# Patient Record
Sex: Female | Born: 1985 | Race: Black or African American | Hispanic: No | Marital: Single | Smoking: Never smoker
Health system: Southern US, Community
[De-identification: ages and names within clinical notes are randomized; demographics above are authoritative.]

## PROBLEM LIST (undated history)

## (undated) DIAGNOSIS — I4891 Unspecified atrial fibrillation: Secondary | ICD-10-CM

## (undated) HISTORY — PX: NO PAST SURGERIES: SHX2092

---

## 2013-04-12 ENCOUNTER — Encounter (HOSPITAL_COMMUNITY): Payer: Self-pay | Admitting: Emergency Medicine

## 2013-04-12 ENCOUNTER — Emergency Department (HOSPITAL_COMMUNITY)
Admission: EM | Admit: 2013-04-12 | Discharge: 2013-04-12 | Disposition: A | Discharge status: Home / Self Care | Payer: Medicaid Other | Attending: Emergency Medicine | Admitting: Emergency Medicine

## 2013-04-12 DIAGNOSIS — J029 Acute pharyngitis, unspecified: Secondary | ICD-10-CM

## 2013-04-12 DIAGNOSIS — R109 Unspecified abdominal pain: Secondary | ICD-10-CM | POA: Insufficient documentation

## 2013-04-12 DIAGNOSIS — Z8679 Personal history of other diseases of the circulatory system: Secondary | ICD-10-CM | POA: Insufficient documentation

## 2013-04-12 DIAGNOSIS — Z7982 Long term (current) use of aspirin: Secondary | ICD-10-CM | POA: Insufficient documentation

## 2013-04-12 HISTORY — DX: Unspecified atrial fibrillation: I48.91

## 2013-04-12 MED ORDER — IBUPROFEN 400 MG PO TABS
600.0000 mg | ORAL_TABLET | Freq: Once | ORAL | Status: AC
  Administered 2013-04-12: 600 mg via ORAL
  Filled 2013-04-12 (×2): qty 1

## 2013-04-12 MED ORDER — IBUPROFEN 600 MG PO TABS: 600.0000 mg | ORAL_TABLET | Freq: Four times a day (QID) | ORAL | Status: DC | PRN

## 2013-04-12 NOTE — ED Notes (Signed)
Pt. reports sore throat , fever and body aches onset this afternoon .

## 2013-04-12 NOTE — ED Provider Notes (Signed)
Medical screening examination/treatment/procedure(s) were performed by non-physician practitioner and as supervising physician I was immediately available for consultation/collaboration.   Loren Racer, MD 04/12/13 [phone]

## 2013-04-12 NOTE — ED Provider Notes (Signed)
CSN: [id number]     Arrival date & time 04/12/13  0105 History   First MD Initiated Contact with Patient 04/12/13 0154     Chief Complaint  Patient presents with  . Sore Throat   (Consider location/radiation/quality/duration/timing/severity/associated sxs/prior Treatment) HPI Comments: Developed sore throat.  This, afternoon.  She was at work.  She has not taken any medication.  Prior to arrival.  Denies any URI symptoms, postnasal drip, seasonal allergies, fever, chills, headache  Patient is a 27 y.o. female presenting with pharyngitis. The history is provided by the patient.  Sore Throat This is a new problem. The problem occurs constantly. The problem has been unchanged. Associated symptoms include abdominal pain and a sore throat. Pertinent negatives include no chills, fever, headaches, myalgias, neck pain or swollen glands. The symptoms are aggravated by swallowing. She has tried nothing for the symptoms. The treatment provided no relief.    Past Medical History  Diagnosis Date  . Atrial fibrillation    History reviewed. No pertinent past surgical history. No family history on file. History  Substance Use Topics  . Smoking status: Never Smoker   . Smokeless tobacco: Not on file  . Alcohol Use: No   OB History   Grav Para Term Preterm Abortions TAB SAB Ect Mult Living                 Review of Systems  Constitutional: Negative for fever and chills.  HENT: Positive for sore throat. Negative for trouble swallowing.   Gastrointestinal: Positive for abdominal pain.  Musculoskeletal: Negative for myalgias and neck pain.  Neurological: Negative for headaches.  All other systems reviewed and are negative.    Allergies  Review of patient's allergies indicates no known allergies.  Home Medications   Current Outpatient Rx  Name  Route  Sig  Dispense  Refill  . aspirin 81 MG tablet   Oral   Take 81 mg by mouth daily.          BP 118/70  Pulse 99  Temp(Src) 99.6 F  (37.6 C) (Oral)  Resp 18  Wt 105 lb (47.628 kg)  SpO2 98%  LMP 03/07/2013 Physical Exam  Nursing note and vitals reviewed. Constitutional: She is oriented to person, place, and time. She appears well-developed and well-nourished.  HENT:  Head: Normocephalic.  Mouth/Throat: Uvula is midline and mucous membranes are normal. Posterior oropharyngeal erythema present. No oropharyngeal exudate, posterior oropharyngeal edema or tonsillar abscesses.  Neck: Normal range of motion.  Cardiovascular: Normal rate and regular rhythm.   Pulmonary/Chest: Effort normal.  Musculoskeletal: Normal range of motion.  Lymphadenopathy:    She has no cervical adenopathy.  Neurological: She is alert and oriented to person, place, and time.    ED Course  Procedures (including critical care time) Labs Review Labs Reviewed  RAPID STREP SCREEN  CULTURE, GROUP A STREP   Imaging Review No results found.  EKG Interpretation   None       MDM  No diagnosis found. Strictest is negative will treat as viral pharyngitis    Arman Filter, NP 04/12/13 [phone]

## 2013-04-15 LAB — CULTURE, GROUP A STREP

## 2013-05-28 ENCOUNTER — Inpatient Hospital Stay (HOSPITAL_COMMUNITY): Payer: Medicaid Other

## 2013-05-28 ENCOUNTER — Inpatient Hospital Stay (HOSPITAL_COMMUNITY)
Admission: AD | Admit: 2013-05-28 | Discharge: 2013-05-28 | Disposition: A | Discharge status: Home / Self Care | Payer: Self-pay | Source: Ambulatory Visit | Attending: Obstetrics & Gynecology | Admitting: Obstetrics & Gynecology

## 2013-05-28 ENCOUNTER — Encounter (HOSPITAL_COMMUNITY): Payer: Self-pay | Admitting: *Deleted

## 2013-05-28 DIAGNOSIS — O418X10 Other specified disorders of amniotic fluid and membranes, first trimester, not applicable or unspecified: Secondary | ICD-10-CM

## 2013-05-28 DIAGNOSIS — O209 Hemorrhage in early pregnancy, unspecified: Secondary | ICD-10-CM | POA: Insufficient documentation

## 2013-05-28 DIAGNOSIS — R109 Unspecified abdominal pain: Secondary | ICD-10-CM | POA: Insufficient documentation

## 2013-05-28 DIAGNOSIS — O4690 Antepartum hemorrhage, unspecified, unspecified trimester: Secondary | ICD-10-CM

## 2013-05-28 LAB — URINALYSIS, ROUTINE W REFLEX MICROSCOPIC
Glucose, UA: NEGATIVE mg/dL
Ketones, ur: NEGATIVE mg/dL
Nitrite: NEGATIVE
Specific Gravity, Urine: <1.005 — ABNORMAL LOW (ref 1.005–1.030)
Urobilinogen, UA: 0.2 mg/dL (ref 0.0–1.0)
pH: 6 (ref 5.0–8.0)

## 2013-05-28 LAB — OB RESULTS CONSOLE GC/CHLAMYDIA
Chlamydia: NEGATIVE
Gonorrhea: NEGATIVE

## 2013-05-28 LAB — WET PREP, GENITAL
Trich, Wet Prep: NONE SEEN
Yeast Wet Prep HPF POC: NONE SEEN

## 2013-05-28 LAB — URINE MICROSCOPIC-ADD ON

## 2013-05-28 NOTE — MAU Provider Note (Signed)
History     CSN: [id number]  Arrival date and time: 05/28/13 1191   First Provider Initiated Contact with Patient 05/28/13 2049      Chief Complaint  Patient presents with  . Vaginal Bleeding   HPI Sherri Buchanan 27 y.o. [redacted]w[redacted]d Comes to MAU with vaginal bleeding for 3 days and tonight passed a small clot.  Also having lower abdominal pain.  Has not had any medical care during the pregnancy.  Is awaiting Medicaid card.  Plans to be seen in the [facility] clinic for care when her card arrives.  OB History   Grav Para Term Preterm Abortions TAB SAB Ect Mult Living   1               Past Medical History  Diagnosis Date  . Atrial fibrillation     Past Surgical History  Procedure Laterality Date  . No past surgeries      Family History  Problem Relation Age of Onset  . Cancer Mother   . Heart disease Mother   . Hypertension Father   . Seizures Father   . Hypertension Sister   . Hypertension Brother   . Cancer Maternal Aunt   . Cancer Maternal Grandmother     History  Substance Use Topics  . Smoking status: Never Smoker   . Smokeless tobacco: Never Used  . Alcohol Use: No    Allergies: No Known Allergies  Prescriptions prior to admission  Medication Sig Dispense Refill  . Prenatal Vit-Fe Fumarate-FA (PRENATAL MULTIVITAMIN) TABS tablet Take 1 tablet by mouth daily at 12 noon.        Review of Systems  Constitutional: Negative for fever.  Gastrointestinal: Positive for abdominal pain. Negative for nausea, vomiting, diarrhea and constipation.  Genitourinary:       No vaginal discharge. Vaginal bleeding. No dysuria.   Physical Exam   Blood pressure 105/60, pulse 84, temperature 98.6 F (37 C), temperature source Oral, resp. rate 16, height 5\' 5"  (1.651 m), weight 97 lb (43.999 kg), last menstrual period 03/14/2013.  Physical Exam  Nursing note and vitals reviewed. Constitutional: She is oriented to person, place, and time. She appears well-developed and  well-nourished.  HENT:  Head: Normocephalic.  Eyes: EOM are normal.  Neck: Neck supple.  GI: Soft. There is no tenderness.  FHT by doppler - 160.  Genitourinary:  Speculum exam: Vagina - Mod amount of pink,creamy discharge Cervix - Small amount of contact bleeding Bimanual exam: Cervix closed, thick Uterus non tender, 10 week size Adnexa non tender, no masses bilaterally GC/Chlam, wet prep done Chaperone present for exam.  Musculoskeletal: Normal range of motion.  Neurological: She is alert and oriented to person, place, and time.  Skin: Skin is warm and dry.  Psychiatric: She has a normal mood and affect.    MAU Course  Procedures Results for orders placed during the hospital encounter of 05/28/13 (from the past 24 hour(s))  URINALYSIS, ROUTINE W REFLEX MICROSCOPIC     Status: Abnormal   Collection Time    05/28/13  8:05 PM      Result Value Range   Color, Urine YELLOW  YELLOW   APPearance CLEAR  CLEAR   Specific Gravity, Urine <1.005 (*) 1.005 - 1.030   pH 6.0  5.0 - 8.0   Glucose, UA NEGATIVE  NEGATIVE mg/dL   Hgb urine dipstick TRACE (*) NEGATIVE   Bilirubin Urine NEGATIVE  NEGATIVE   Ketones, ur NEGATIVE  NEGATIVE mg/dL   Protein, ur  NEGATIVE  NEGATIVE mg/dL   Urobilinogen, UA 0.2  0.0 - 1.0 mg/dL   Nitrite NEGATIVE  NEGATIVE   Leukocytes, UA NEGATIVE  NEGATIVE  URINE MICROSCOPIC-ADD ON     Status: None   Collection Time    05/28/13  8:05 PM      Result Value Range   Squamous Epithelial / LPF RARE  RARE   WBC, UA 0-2  <3 WBC/hpf   RBC / HPF 0-2  <3 RBC/hpf   Bacteria, UA RARE  RARE  WET PREP, GENITAL     Status: Abnormal   Collection Time    05/28/13  8:55 PM      Result Value Range   Yeast Wet Prep HPF POC NONE SEEN  NONE SEEN   Trich, Wet Prep NONE SEEN  NONE SEEN   Clue Cells Wet Prep HPF POC FEW (*) NONE SEEN   WBC, Wet Prep HPF POC FEW (*) NONE SEEN    MDM CLINICAL DATA: Vaginal spotting.  EXAM:  OBSTETRIC <14 WK ULTRASOUND  TECHNIQUE:   Transabdominal ultrasound was performed for evaluation of the  gestation as well as the maternal uterus and adnexal regions.  COMPARISON: None.  FINDINGS:  Intrauterine gestational sac: Visualized/normal in shape.  Yolk sac: Yes  Embryo: Yes  Cardiac Activity: Yes  Heart Rate: 163 bpm  CRL: 45.3 mm 11 w 3 d [place] EDC: 12/14/2013  Maternal uterus/adnexae: A small amount of subchorionic hemorrhage  is noted. Uterus is otherwise unremarkable in appearance.  The ovaries are within normal limits. The right ovary measures 3.4 x  1.5 x 1.7 cm, while the left ovary measures 3.3 x 2.3 x 2.5 cm. No  suspicious adnexal masses are seen; there is no evidence for ovarian  torsion.  No free fluid is seen within the pelvic cul-de-sac.  IMPRESSION:  1. Single live intrauterine pregnancy noted, with a crown-rump  length of 4.5 cm, corresponding to a gestational age of [redacted] weeks 3  days. This matches the gestational age of [redacted] weeks 5 days by LMP,  reflecting an estimated date of delivery of December 19, 2013.  2. Small amount of subchorionic hemorrhage noted.    Assessment and Plan  Vaginal bleeding in pregnancy  Plan No sex, nothing in the vagina until you are seen by a doctor. You may have some continued bleeding as the subchorionic hemorrhage resolves. No smoking, no drugs, no alcohol.  Take a prenatal vitamin one by mouth every day.  Eat small frequent snacks to avoid nausea.  Begin prenatal care as soon as possible.    Dominic Mahaney 05/28/2013, 9:06 PM

## 2013-05-28 NOTE — MAU Note (Signed)
I've been having spotting for awhile but today I had Psychologist, clinical. I started eating something and had pain in lower abd. Stopped eating and after awhile the pain improved. Hungry at home and ate again and did ok. Tonight went to BR and saw small orange-red blood clot on tissue when wiped.

## 2013-05-28 NOTE — MAU Note (Signed)
Patient presents with complaint of spotting X 3 weeks that progressed to vaginal bleeding with clots 45 minutes ago.

## 2013-05-28 NOTE — Progress Notes (Signed)
Lilyan Punt NP in to see pt and discuss test results and d/c plan. Written and verbal d/c instructions given and understanding voiced.

## 2013-05-30 LAB — GC/CHLAMYDIA PROBE AMP
CT Probe RNA: NEGATIVE
GC Probe RNA: NEGATIVE

## 2013-06-23 NOTE — L&D Delivery Note (Signed)
Delivery Note At 2:05 PM a viable female was delivered via Vaginal, Spontaneous Delivery (Presentation: ; Occiput Anterior).  APGAR: 8, 9; weight TBD.   Placenta status: Intact, Spontaneous.  Cord: 3 vessels with the following complications: None.    Anesthesia: Epidural  Episiotomy: None Lacerations: 1st degree;Perineal Suture Repair: 3.0 monocryl Est. Blood Loss (mL): 350  Mom to postpartum.  Baby to Couplet care / Skin to Skin.  Pt pushed with good maternal effort to deliver a liveborn female via NSVD with spontaneous cry.   Baby placed on maternal abdomen.  Delayed cord clamping performed.  Cord cut by FOB.  Placenta delivered intact with 3V cord via traction and pitocin.  1st degree tear repaired with single figure of 8 and a single subcuticular for cosmesis. No complications.  Mom and baby to postpartum. Mom spiked a temperature immediately at delivery but will monitor off abx for now.    Ivette Castronova L 12/22/2013, 2:56 PM

## 2013-07-13 ENCOUNTER — Ambulatory Visit (INDEPENDENT_AMBULATORY_CARE_PROVIDER_SITE_OTHER): Payer: BC Managed Care – PPO | Admitting: Advanced Practice Midwife

## 2013-07-13 ENCOUNTER — Encounter: Payer: Self-pay | Admitting: Advanced Practice Midwife

## 2013-07-13 VITALS — BP 111/71 | Temp 97.0°F | Wt 100.1 lb

## 2013-07-13 DIAGNOSIS — O0930 Supervision of pregnancy with insufficient antenatal care, unspecified trimester: Secondary | ICD-10-CM

## 2013-07-13 DIAGNOSIS — Z23 Encounter for immunization: Secondary | ICD-10-CM

## 2013-07-13 DIAGNOSIS — O0932 Supervision of pregnancy with insufficient antenatal care, second trimester: Secondary | ICD-10-CM | POA: Insufficient documentation

## 2013-07-13 LAB — POCT URINALYSIS DIP (DEVICE)
Bilirubin Urine: NEGATIVE
Glucose, UA: NEGATIVE mg/dL
Hgb urine dipstick: NEGATIVE
Ketones, ur: NEGATIVE mg/dL
NITRITE: NEGATIVE
PH: 7.5 (ref 5.0–8.0)
PROTEIN: NEGATIVE mg/dL
Specific Gravity, Urine: 1.015 (ref 1.005–1.030)
UROBILINOGEN UA: 0.2 mg/dL (ref 0.0–1.0)

## 2013-07-13 NOTE — Progress Notes (Signed)
Ob US scheduled on 08/01/13 @ 1030

## 2013-07-13 NOTE — Progress Notes (Signed)
Pulse- 82 Reports lower abdominal pressure

## 2013-07-13 NOTE — Addendum Note (Signed)
Addended by: Aviva SignsWILLIAMS, Donavyn Fecher L on: 07/13/2013 04:13 PM   Modules accepted: Orders

## 2013-07-13 NOTE — Progress Notes (Signed)
New OB. See Smartset note   Subjective:    Sherri Buchanan is a G1P0000 363w2d being seen today for her first obstetrical visit.  Her obstetrical history is significant for late to care. Patient does intend to breast feed. Pregnancy history fully reviewed.  Patient reports backache.  Filed Vitals:   07/13/13 1448  BP: 111/71  Temp: 97 F (36.1 C)  Weight: 45.405 kg (100 lb 1.6 oz)    HISTORY: OB History  Gravida Para Term Preterm AB SAB TAB Ectopic Multiple Living  1 0 0 0 0 0 0 0 0 0     # Outcome Date GA Lbr Len/2nd Weight Sex Delivery Anes PTL Lv  1 CUR              Past Medical History  Diagnosis Date  . Atrial fibrillation    Past Surgical History  Procedure Laterality Date  . No past surgeries     Family History  Problem Relation Age of Onset  . Cancer Mother   . Heart disease Mother   . Hypertension Father   . Seizures Father   . Hypertension Sister   . Hypertension Brother   . Cancer Maternal Aunt   . Cancer Maternal Grandmother      Exam    Uterus:  Fundal Height: 18 cm  Pelvic Exam:    Perineum: No Hemorrhoids   Vulva: Bartholin's, Urethra, Skene's normal   Vagina:  normal mucosa, normal discharge   pH:    Cervix: nulliparous appearance   Adnexa: normal adnexa and no mass, fullness, tenderness   Bony Pelvis: gynecoid  System: Breast:  normal appearance, no masses or tenderness   Skin: normal coloration and turgor, no rashes    Neurologic: oriented, grossly non-focal   Extremities: normal strength, tone, and muscle mass   HEENT neck supple with midline trachea   Mouth/Teeth mucous membranes moist, pharynx normal without lesions   Neck supple and no masses   Cardiovascular: regular rate and rhythm, no murmurs or gallops   Respiratory:  appears well, vitals normal, no respiratory distress, acyanotic, normal RR, ear and throat exam is normal, neck free of mass or lymphadenopathy, chest clear, no wheezing, crepitations, rhonchi, normal symmetric  air entry   Abdomen: soft, non-tender; bowel sounds normal; no masses,  no organomegaly   Urinary: urethral meatus normal      Assessment:    Pregnancy: G1P0000 Patient Active Problem List   Diagnosis Date Noted  . Late prenatal care complicating pregnancy in second trimester 07/13/2013        Plan:     Initial labs drawn. Prenatal vitamins. Problem list reviewed and updated. Genetic Screening discussed Quad Screen: ordered.  Ultrasound discussed; fetal survey: ordered.  Follow up in 4 weeks. 50% of 30 min visit spent on counseling and coordination of care.   Routines reviewed.  [place] in [place]. Had pap last year, normal   [facility] CenterWILLIAMS,MARIE 07/13/2013

## 2013-07-13 NOTE — Patient Instructions (Signed)
Second Trimester of Pregnancy The second trimester is from week 13 through week 28, months 4 through 6. The second trimester is often a time when you feel your best. Your body has also adjusted to being pregnant, and you begin to feel better physically. Usually, morning sickness has lessened or quit completely, you may have more energy, and you may have an increase in appetite. The second trimester is also a time when the fetus is growing rapidly. At the end of the sixth month, the fetus is about 9 inches long and weighs about 1 pounds. You will likely begin to feel the baby move (quickening) between 18 and 20 weeks of the pregnancy. BODY CHANGES Your body goes through many changes during pregnancy. The changes vary from woman to woman.   Your weight will continue to increase. You will notice your lower abdomen bulging out.  You may begin to get stretch marks on your hips, abdomen, and breasts.  You may develop headaches that can be relieved by medicines approved by your caregiver.  You may urinate more often because the fetus is pressing on your bladder.  You may develop or continue to have heartburn as a result of your pregnancy.  You may develop constipation because certain hormones are causing the muscles that push waste through your intestines to slow down.  You may develop hemorrhoids or swollen, bulging veins (varicose veins).  You may have back pain because of the weight gain and pregnancy hormones relaxing your joints between the bones in your pelvis and as a result of a shift in weight and the muscles that support your balance.  Your breasts will continue to grow and be tender.  Your gums may bleed and may be sensitive to brushing and flossing.  Dark spots or blotches (chloasma, mask of pregnancy) may develop on your face. This will likely fade after the baby is born.  A dark line from your belly button to the pubic area (linea nigra) may appear. This will likely fade after the  baby is born. WHAT TO EXPECT AT YOUR PRENATAL VISITS During a routine prenatal visit:  You will be weighed to make sure you and the fetus are growing normally.  Your blood pressure will be taken.  Your abdomen will be measured to track your baby's growth.  The fetal heartbeat will be listened to.  Any test results from the previous visit will be discussed. Your caregiver may ask you:  How you are feeling.  If you are feeling the baby move.  If you have had any abnormal symptoms, such as leaking fluid, bleeding, severe headaches, or abdominal cramping.  If you have any questions. Other tests that may be performed during your second trimester include:  Blood tests that check for:  Low iron levels (anemia).  Gestational diabetes (between 24 and 28 weeks).  Rh antibodies.  Urine tests to check for infections, diabetes, or protein in the urine.  An ultrasound to confirm the proper growth and development of the baby.  An amniocentesis to check for possible genetic problems.  Fetal screens for spina bifida and Down syndrome. HOME CARE INSTRUCTIONS   Avoid all smoking, herbs, alcohol, and unprescribed drugs. These chemicals affect the formation and growth of the baby.  Follow your caregiver's instructions regarding medicine use. There are medicines that are either safe or unsafe to take during pregnancy.  Exercise only as directed by your caregiver. Experiencing uterine cramps is a good sign to stop exercising.  Continue to eat regular,   healthy meals.  Wear a good support bra for breast tenderness.  Do not use hot tubs, steam rooms, or saunas.  Wear your seat belt at all times when driving.  Avoid raw meat, uncooked cheese, cat litter boxes, and soil used by cats. These carry germs that can cause birth defects in the baby.  Take your prenatal vitamins.  Try taking a stool softener (if your caregiver approves) if you develop constipation. Eat more high-fiber foods,  such as fresh vegetables or fruit and whole grains. Drink plenty of fluids to keep your urine clear or pale yellow.  Take warm sitz baths to soothe any pain or discomfort caused by hemorrhoids. Use hemorrhoid cream if your caregiver approves.  If you develop varicose veins, wear support hose. Elevate your feet for 15 minutes, 3 4 times a day. Limit salt in your diet.  Avoid heavy lifting, wear low heel shoes, and practice good posture.  Rest with your legs elevated if you have leg cramps or low back pain.  Visit your dentist if you have not gone yet during your pregnancy. Use a soft toothbrush to brush your teeth and be gentle when you floss.  A sexual relationship may be continued unless your caregiver directs you otherwise.  Continue to go to all your prenatal visits as directed by your caregiver. SEEK MEDICAL CARE IF:   You have dizziness.  You have mild pelvic cramps, pelvic pressure, or nagging pain in the abdominal area.  You have persistent nausea, vomiting, or diarrhea.  You have a bad smelling vaginal discharge.  You have pain with urination. SEEK IMMEDIATE MEDICAL CARE IF:   You have a fever.  You are leaking fluid from your vagina.  You have spotting or bleeding from your vagina.  You have severe abdominal cramping or pain.  You have rapid weight gain or loss.  You have shortness of breath with chest pain.  You notice sudden or extreme swelling of your face, hands, ankles, feet, or legs.  You have not felt your baby move in over an hour.  You have severe headaches that do not go away with medicine.  You have vision changes. Document Released: 06/03/2001 Document Revised: 02/09/2013 Document Reviewed: 08/10/2012 Exit[employer] Patient Information 2014 [employer].  

## 2013-07-14 LAB — PRESCRIPTION MONITORING PROFILE (19 PANEL)
AMPHETAMINE/METH: NEGATIVE ng/mL
BARBITURATE SCREEN, URINE: NEGATIVE ng/mL
BUPRENORPHINE, URINE: NEGATIVE ng/mL
Benzodiazepine Screen, Urine: NEGATIVE ng/mL
CANNABINOID SCRN UR: NEGATIVE ng/mL
CARISOPRODOL, URINE: NEGATIVE ng/mL
Cocaine Metabolites: NEGATIVE ng/mL
Creatinine, Urine: 82.4 mg/dL (ref >20.0)
Fentanyl, Ur: NEGATIVE ng/mL
MDMA URINE: NEGATIVE ng/mL
METHADONE SCREEN, URINE: NEGATIVE ng/mL
METHAQUALONE SCREEN (URINE): NEGATIVE ng/mL
Meperidine, Ur: NEGATIVE ng/mL
NITRITES URINE, INITIAL: NEGATIVE ug/mL
OPIATE SCREEN, URINE: NEGATIVE ng/mL
Oxycodone Screen, Ur: NEGATIVE ng/mL
PHENCYCLIDINE, UR: NEGATIVE ng/mL
PROPOXYPHENE: NEGATIVE ng/mL
TAPENTADOLUR: NEGATIVE ng/mL
Tramadol Scrn, Ur: NEGATIVE ng/mL
Zolpidem, Urine: NEGATIVE ng/mL
pH, Initial: 7.5 pH (ref 4.5–8.9)

## 2013-07-14 LAB — OBSTETRIC PANEL
ANTIBODY SCREEN: NEGATIVE
BASOS ABS: 0 10*3/uL (ref 0.0–0.1)
Basophils Relative: 0 % (ref 0–1)
EOS PCT: 1 % (ref 0–5)
Eosinophils Absolute: 0 10*3/uL (ref 0.0–0.7)
HCT: 33.8 % — ABNORMAL LOW (ref 36.0–46.0)
Hemoglobin: 11.7 g/dL — ABNORMAL LOW (ref 12.0–15.0)
Hepatitis B Surface Ag: NEGATIVE
LYMPHS PCT: 17 % (ref 12–46)
Lymphs Abs: 1.4 10*3/uL (ref 0.7–4.0)
MCH: 30.2 pg (ref 26.0–34.0)
MCHC: 34.6 g/dL (ref 30.0–36.0)
MCV: 87.1 fL (ref 78.0–100.0)
Monocytes Absolute: 0.7 10*3/uL (ref 0.1–1.0)
Monocytes Relative: 8 % (ref 3–12)
Neutro Abs: 6.2 10*3/uL (ref 1.7–7.7)
Neutrophils Relative %: 74 % (ref 43–77)
PLATELETS: 229 10*3/uL (ref 150–400)
RBC: 3.88 MIL/uL (ref 3.87–5.11)
RDW: 14.6 % (ref 11.5–15.5)
RUBELLA: 4.43 {index} — AB (ref <0.90)
Rh Type: POSITIVE
WBC: 8.3 10*3/uL (ref 4.0–10.5)

## 2013-07-14 LAB — HIV ANTIBODY (ROUTINE TESTING W REFLEX): HIV: NONREACTIVE

## 2013-07-15 LAB — AFP, QUAD SCREEN
AFP: 50.9 IU/mL
Age Alone: 1:987 {titer}
Curr Gest Age: 17.2 wks.days
Down Syndrome Scr Risk Est: 1:4770 {titer}
HCG TOTAL: 55373 m[IU]/mL
INH: 280.3 pg/mL
Interpretation-AFP: NEGATIVE
MOM FOR HCG: 1.87
MoM for AFP: 1.06
MoM for INH: 1.38
OPEN SPINA BIFIDA: NEGATIVE
TRI 18 SCR RISK EST: NEGATIVE
Trisomy 18 (Edward) Syndrome Interp.: <1:78600 {titer}
UE3 VALUE: 1.4 ng/mL
uE3 Mom: 2.12

## 2013-07-15 LAB — HEMOGLOBINOPATHY EVALUATION
HGB A: 97.1 % (ref 96.8–97.8)
HGB F QUANT: 0 % (ref 0.0–2.0)
HGB S QUANTITAION: 0 %
Hemoglobin Other: 0 %
Hgb A2 Quant: 2.9 % (ref 2.2–3.2)

## 2013-07-15 LAB — CULTURE, OB URINE: Colony Count: 4000

## 2013-08-01 ENCOUNTER — Ambulatory Visit (HOSPITAL_COMMUNITY)
Admission: RE | Admit: 2013-08-01 | Discharge: 2013-08-01 | Disposition: A | Discharge status: Home / Self Care | Payer: BC Managed Care – PPO | Source: Ambulatory Visit | Attending: Advanced Practice Midwife | Admitting: Advanced Practice Midwife

## 2013-08-01 ENCOUNTER — Other Ambulatory Visit: Payer: Self-pay | Admitting: Advanced Practice Midwife

## 2013-08-01 DIAGNOSIS — Z3689 Encounter for other specified antenatal screening: Secondary | ICD-10-CM | POA: Insufficient documentation

## 2013-08-01 DIAGNOSIS — O0932 Supervision of pregnancy with insufficient antenatal care, second trimester: Secondary | ICD-10-CM

## 2013-08-01 NOTE — Progress Notes (Signed)
MFM ultrasound  Indication: [age] yr old G1P0 at 384w0d for fetal anatomic survey. Remote read.  Findings: 1. Single intrauterine pregnancy. 2. Fetal biometry is consistent with dating. 3. Posterior placenta without evidence of previa. 4 . Normal amniotic fluid volume. 5. Normal transabdominal cervical length. 6. The views of the cavum, orbits, palate, profile/nasal bone, and heart are limited. 7. The remainder of the limited anatomy survey is normal.  Recommendations: 1. Appropriate fetal growth. 2. Limited anatomy survey: - recommend follow up in 2-4 weeks to complete anatomic survey 3. Normal quad screen.  Eulis FosterKristen Dorothea Yow, MD

## 2013-08-10 ENCOUNTER — Encounter: Payer: BC Managed Care – PPO | Admitting: Advanced Practice Midwife

## 2013-08-23 ENCOUNTER — Ambulatory Visit (INDEPENDENT_AMBULATORY_CARE_PROVIDER_SITE_OTHER): Payer: BC Managed Care – PPO | Admitting: Advanced Practice Midwife

## 2013-08-23 VITALS — BP 118/72 | Temp 97.6°F | Wt 108.7 lb

## 2013-08-23 DIAGNOSIS — O0930 Supervision of pregnancy with insufficient antenatal care, unspecified trimester: Secondary | ICD-10-CM

## 2013-08-23 DIAGNOSIS — K219 Gastro-esophageal reflux disease without esophagitis: Secondary | ICD-10-CM

## 2013-08-23 DIAGNOSIS — O0932 Supervision of pregnancy with insufficient antenatal care, second trimester: Secondary | ICD-10-CM

## 2013-08-23 LAB — POCT URINALYSIS DIP (DEVICE)
Bilirubin Urine: NEGATIVE
Glucose, UA: NEGATIVE mg/dL
Hgb urine dipstick: NEGATIVE
Ketones, ur: NEGATIVE mg/dL
Nitrite: NEGATIVE
PH: 8.5 — AB (ref 5.0–8.0)
PROTEIN: 30 mg/dL — AB
SPECIFIC GRAVITY, URINE: 1.015 (ref 1.005–1.030)
UROBILINOGEN UA: 0.2 mg/dL (ref 0.0–1.0)

## 2013-08-23 MED ORDER — RANITIDINE HCL 150 MG PO TABS: 150.0000 mg | ORAL_TABLET | Freq: Two times a day (BID) | ORAL | Status: DC

## 2013-08-23 NOTE — Progress Notes (Signed)
P-82 

## 2013-08-23 NOTE — Progress Notes (Signed)
Doing well.  Good fetal movement, denies vaginal bleeding, LOF, cramping/contractions.  Daily heartburn.  Zantac 150 mg BID. F/U ultrasound scheduled to complete anatomy.

## 2013-08-30 ENCOUNTER — Ambulatory Visit (HOSPITAL_COMMUNITY)
Admission: RE | Admit: 2013-08-30 | Discharge: 2013-08-30 | Disposition: A | Discharge status: Home / Self Care | Payer: BC Managed Care – PPO | Source: Ambulatory Visit | Attending: Advanced Practice Midwife | Admitting: Advanced Practice Midwife

## 2013-08-30 DIAGNOSIS — Z3689 Encounter for other specified antenatal screening: Secondary | ICD-10-CM | POA: Insufficient documentation

## 2013-08-30 DIAGNOSIS — O0932 Supervision of pregnancy with insufficient antenatal care, second trimester: Secondary | ICD-10-CM

## 2013-09-20 ENCOUNTER — Ambulatory Visit (INDEPENDENT_AMBULATORY_CARE_PROVIDER_SITE_OTHER): Payer: BC Managed Care – PPO | Admitting: Advanced Practice Midwife

## 2013-09-20 VITALS — BP 116/71 | Temp 98.4°F | Wt 114.6 lb

## 2013-09-20 DIAGNOSIS — O0930 Supervision of pregnancy with insufficient antenatal care, unspecified trimester: Secondary | ICD-10-CM

## 2013-09-20 DIAGNOSIS — Z23 Encounter for immunization: Secondary | ICD-10-CM

## 2013-09-20 DIAGNOSIS — O0932 Supervision of pregnancy with insufficient antenatal care, second trimester: Secondary | ICD-10-CM

## 2013-09-20 LAB — CBC
HCT: 29 % — ABNORMAL LOW (ref 36.0–46.0)
Hemoglobin: 10.1 g/dL — ABNORMAL LOW (ref 12.0–15.0)
MCH: 29.1 pg (ref 26.0–34.0)
MCHC: 34.8 g/dL (ref 30.0–36.0)
MCV: 83.6 fL (ref 78.0–100.0)
Platelets: 232 10*3/uL (ref 150–400)
RBC: 3.47 MIL/uL — ABNORMAL LOW (ref 3.87–5.11)
RDW: 13.2 % (ref 11.5–15.5)
WBC: 11 10*3/uL — AB (ref 4.0–10.5)

## 2013-09-20 LAB — POCT URINALYSIS DIP (DEVICE)
Bilirubin Urine: NEGATIVE
GLUCOSE, UA: NEGATIVE mg/dL
HGB URINE DIPSTICK: NEGATIVE
KETONES UR: NEGATIVE mg/dL
Nitrite: NEGATIVE
PH: 7 (ref 5.0–8.0)
Protein, ur: NEGATIVE mg/dL
SPECIFIC GRAVITY, URINE: 1.02 (ref 1.005–1.030)
Urobilinogen, UA: 1 mg/dL (ref 0.0–1.0)

## 2013-09-20 LAB — GLUCOSE TOLERANCE, 1 HOUR (50G) W/O FASTING: Glucose, 1 Hour GTT: 83 mg/dL (ref 70–140)

## 2013-09-20 MED ORDER — TETANUS-DIPHTH-ACELL PERTUSSIS 5-2.5-18.5 LF-MCG/0.5 IM SUSP: 0.5000 mL | Freq: Once | INTRAMUSCULAR | Status: DC

## 2013-09-20 NOTE — Progress Notes (Signed)
P= 87 1hr gtt and 28week labs today.

## 2013-09-20 NOTE — Progress Notes (Signed)
Doing well.  Good fetal movement, denies vaginal bleeding, LOF, regular contractions.  Reports "baby balling up" a few times per day, especially when pt is at work moving around.  Reviewed signs of PTL, recommend increased PO fluids.  28 week labs today.

## 2013-09-21 LAB — RPR

## 2013-09-21 LAB — HIV ANTIBODY (ROUTINE TESTING W REFLEX): HIV: NONREACTIVE

## 2013-10-04 ENCOUNTER — Encounter: Payer: Self-pay | Admitting: Advanced Practice Midwife

## 2013-10-04 ENCOUNTER — Ambulatory Visit (INDEPENDENT_AMBULATORY_CARE_PROVIDER_SITE_OTHER): Payer: BC Managed Care – PPO | Admitting: Advanced Practice Midwife

## 2013-10-04 VITALS — BP 120/69 | Wt 122.8 lb

## 2013-10-04 DIAGNOSIS — O0930 Supervision of pregnancy with insufficient antenatal care, unspecified trimester: Secondary | ICD-10-CM

## 2013-10-04 DIAGNOSIS — O26899 Other specified pregnancy related conditions, unspecified trimester: Secondary | ICD-10-CM

## 2013-10-04 DIAGNOSIS — R102 Pelvic and perineal pain: Secondary | ICD-10-CM

## 2013-10-04 DIAGNOSIS — O0932 Supervision of pregnancy with insufficient antenatal care, second trimester: Secondary | ICD-10-CM

## 2013-10-04 DIAGNOSIS — O9989 Other specified diseases and conditions complicating pregnancy, childbirth and the puerperium: Secondary | ICD-10-CM

## 2013-10-04 DIAGNOSIS — N949 Unspecified condition associated with female genital organs and menstrual cycle: Secondary | ICD-10-CM

## 2013-10-04 LAB — POCT URINALYSIS DIP (DEVICE)
BILIRUBIN URINE: NEGATIVE
Glucose, UA: NEGATIVE mg/dL
HGB URINE DIPSTICK: NEGATIVE
KETONES UR: NEGATIVE mg/dL
Nitrite: NEGATIVE
PROTEIN: NEGATIVE mg/dL
SPECIFIC GRAVITY, URINE: 1.015 (ref 1.005–1.030)
Urobilinogen, UA: 1 mg/dL (ref 0.0–1.0)
pH: 7 (ref 5.0–8.0)

## 2013-10-04 NOTE — Patient Instructions (Addendum)
[facility] Dr Renne CriglerPharr [address], [place], [place] [address] [phone]    Dr Tinnie Gensanya [facility] [address] [place], [place] [phone]   No Primary Care Doctor:  To locate a primary care doctor that accepts your insurance or provides certain services:           [facility]: [phone]           Physician Referral Service: [phone] ask for "My [facility]"   If no insurance, you need to see if you qualify for [facility] "orange card", call to set      up appointment for eligibility/enrollment at [phone] or [phone] or visit [facility] and [employer] ([address], [place] and [address]) to meet with a [facility] enrollment specialist.

## 2013-10-04 NOTE — Progress Notes (Signed)
Pulse: 92

## 2013-10-04 NOTE — Progress Notes (Signed)
Doing well.  Good fetal movement, denies vaginal bleeding, LOF, regular contractions.  Reports intermittent cramping while at work, improves when resting.  Urine with large leukocytes--sent for culture.  Recommend pregnancy support belt while at work.

## 2013-10-06 LAB — CULTURE, OB URINE
Colony Count: NO GROWTH
Organism ID, Bacteria: NO GROWTH

## 2013-10-12 ENCOUNTER — Encounter: Payer: Self-pay | Admitting: Advanced Practice Midwife

## 2013-10-18 ENCOUNTER — Encounter: Payer: Self-pay | Admitting: Obstetrics and Gynecology

## 2013-10-18 ENCOUNTER — Ambulatory Visit (INDEPENDENT_AMBULATORY_CARE_PROVIDER_SITE_OTHER): Payer: BC Managed Care – PPO | Admitting: Obstetrics and Gynecology

## 2013-10-18 VITALS — BP 113/75 | HR 85 | Temp 96.7°F | Wt 119.6 lb

## 2013-10-18 DIAGNOSIS — O0930 Supervision of pregnancy with insufficient antenatal care, unspecified trimester: Secondary | ICD-10-CM

## 2013-10-18 DIAGNOSIS — O0932 Supervision of pregnancy with insufficient antenatal care, second trimester: Secondary | ICD-10-CM

## 2013-10-18 LAB — POCT URINALYSIS DIP (DEVICE)
BILIRUBIN URINE: NEGATIVE
GLUCOSE, UA: NEGATIVE mg/dL
Hgb urine dipstick: NEGATIVE
Ketones, ur: NEGATIVE mg/dL
NITRITE: NEGATIVE
Protein, ur: NEGATIVE mg/dL
Specific Gravity, Urine: 1.015 (ref 1.005–1.030)
Urobilinogen, UA: 1 mg/dL (ref 0.0–1.0)
pH: 7.5 (ref 5.0–8.0)

## 2013-10-18 NOTE — Progress Notes (Signed)
Doing well. Thinks she has become lactose intolerant so avoiding dairy and using OTC lactose substitute. Not committed to breastfeeding, but will try while in hospital. Pregnancy danger signs reviewed. Classes recommended.

## 2013-10-18 NOTE — Patient Instructions (Signed)
Third Trimester of Pregnancy  The third trimester is from week 29 through week 42, months 7 through 9. The third trimester is a time when the fetus is growing rapidly. At the end of the ninth month, the fetus is about 20 inches in length and weighs 6 10 pounds.   BODY CHANGES  Your body goes through many changes during pregnancy. The changes vary from woman to woman.    Your weight will continue to increase. You can expect to gain 25 35 pounds (11 16 kg) by the end of the pregnancy.   You may begin to get stretch marks on your hips, abdomen, and breasts.   You may urinate more often because the fetus is moving lower into your pelvis and pressing on your bladder.   You may develop or continue to have heartburn as a result of your pregnancy.   You may develop constipation because certain hormones are causing the muscles that push waste through your intestines to slow down.   You may develop hemorrhoids or swollen, bulging veins (varicose veins).   You may have pelvic pain because of the weight gain and pregnancy hormones relaxing your joints between the bones in your pelvis. Back aches may result from over exertion of the muscles supporting your posture.   Your breasts will continue to grow and be tender. A yellow discharge may leak from your breasts called colostrum.   Your belly button may stick out.   You may feel short of breath because of your expanding uterus.   You may notice the fetus "dropping," or moving lower in your abdomen.   You may have a bloody mucus discharge. This usually occurs a few days to a week before labor begins.   Your cervix becomes thin and soft (effaced) near your due date.  WHAT TO EXPECT AT YOUR PRENATAL EXAMS   You will have prenatal exams every 2 weeks until week 36. Then, you will have weekly prenatal exams. During a routine prenatal visit:   You will be weighed to make sure you and the fetus are growing normally.   Your blood pressure is taken.   Your abdomen will be  measured to track your baby's growth.   The fetal heartbeat will be listened to.   Any test results from the previous visit will be discussed.   You may have a cervical check near your due date to see if you have effaced.  At around 36 weeks, your caregiver will check your cervix. At the same time, your caregiver will also perform a test on the secretions of the vaginal tissue. This test is to determine if a type of bacteria, Group B streptococcus, is present. Your caregiver will explain this further.  Your caregiver may ask you:   What your birth plan is.   How you are feeling.   If you are feeling the baby move.   If you have had any abnormal symptoms, such as leaking fluid, bleeding, severe headaches, or abdominal cramping.   If you have any questions.  Other tests or screenings that may be performed during your third trimester include:   Blood tests that check for low iron levels (anemia).   Fetal testing to check the health, activity level, and growth of the fetus. Testing is done if you have certain medical conditions or if there are problems during the pregnancy.  FALSE LABOR  You may feel small, irregular contractions that eventually go away. These are called Braxton Hicks contractions, or   false labor. Contractions may last for hours, days, or even weeks before true labor sets in. If contractions come at regular intervals, intensify, or become painful, it is best to be seen by your caregiver.   SIGNS OF LABOR    Menstrual-like cramps.   Contractions that are 5 minutes apart or less.   Contractions that start on the top of the uterus and spread down to the lower abdomen and back.   A sense of increased pelvic pressure or back pain.   A watery or bloody mucus discharge that comes from the vagina.  If you have any of these signs before the 37th week of pregnancy, call your caregiver right away. You need to go to the hospital to get checked immediately.  HOME CARE INSTRUCTIONS    Avoid all  smoking, herbs, alcohol, and unprescribed drugs. These chemicals affect the formation and growth of the baby.   Follow your caregiver's instructions regarding medicine use. There are medicines that are either safe or unsafe to take during pregnancy.   Exercise only as directed by your caregiver. Experiencing uterine cramps is a good sign to stop exercising.   Continue to eat regular, healthy meals.   Wear a good support bra for breast tenderness.   Do not use hot tubs, steam rooms, or saunas.   Wear your seat belt at all times when driving.   Avoid raw meat, uncooked cheese, cat litter boxes, and soil used by cats. These carry germs that can cause birth defects in the baby.   Take your prenatal vitamins.   Try taking a stool softener (if your caregiver approves) if you develop constipation. Eat more high-fiber foods, such as fresh vegetables or fruit and whole grains. Drink plenty of fluids to keep your urine clear or pale yellow.   Take warm sitz baths to soothe any pain or discomfort caused by hemorrhoids. Use hemorrhoid cream if your caregiver approves.   If you develop varicose veins, wear support hose. Elevate your feet for 15 minutes, 3 4 times a day. Limit salt in your diet.   Avoid heavy lifting, wear low heal shoes, and practice good posture.   Rest a lot with your legs elevated if you have leg cramps or low back pain.   Visit your dentist if you have not gone during your pregnancy. Use a soft toothbrush to brush your teeth and be gentle when you floss.   A sexual relationship may be continued unless your caregiver directs you otherwise.   Do not travel far distances unless it is absolutely necessary and only with the approval of your caregiver.   Take prenatal classes to understand, practice, and ask questions about the labor and delivery.   Make a trial run to the hospital.   Pack your hospital bag.   Prepare the baby's nursery.   Continue to go to all your prenatal visits as directed  by your caregiver.  SEEK MEDICAL CARE IF:   You are unsure if you are in labor or if your water has broken.   You have dizziness.   You have mild pelvic cramps, pelvic pressure, or nagging pain in your abdominal area.   You have persistent nausea, vomiting, or diarrhea.   You have a bad smelling vaginal discharge.   You have pain with urination.  SEEK IMMEDIATE MEDICAL CARE IF:    You have a fever.   You are leaking fluid from your vagina.   You have spotting or bleeding from your vagina.     You have severe abdominal cramping or pain.   You have rapid weight loss or gain.   You have shortness of breath with chest pain.   You notice sudden or extreme swelling of your face, hands, ankles, feet, or legs.   You have not felt your baby move in over an hour.   You have severe headaches that do not go away with medicine.   You have vision changes.  Document Released: 06/03/2001 Document Revised: 02/09/2013 Document Reviewed: 08/10/2012  [employer] Patient Information 2014 [employer].

## 2013-10-18 NOTE — Progress Notes (Signed)
Patient reports some pelvic pressure  

## 2013-11-02 ENCOUNTER — Encounter: Payer: BC Managed Care – PPO | Admitting: Family

## 2013-11-16 ENCOUNTER — Telehealth: Payer: Self-pay | Admitting: *Deleted

## 2013-11-16 ENCOUNTER — Ambulatory Visit (INDEPENDENT_AMBULATORY_CARE_PROVIDER_SITE_OTHER): Payer: BC Managed Care – PPO | Admitting: Family

## 2013-11-16 VITALS — BP 114/71 | HR 93 | Wt 125.8 lb

## 2013-11-16 DIAGNOSIS — Z3490 Encounter for supervision of normal pregnancy, unspecified, unspecified trimester: Secondary | ICD-10-CM

## 2013-11-16 DIAGNOSIS — Z3480 Encounter for supervision of other normal pregnancy, unspecified trimester: Secondary | ICD-10-CM

## 2013-11-16 LAB — OB RESULTS CONSOLE GC/CHLAMYDIA
Chlamydia: NEGATIVE
GC PROBE AMP, GENITAL: NEGATIVE

## 2013-11-16 LAB — POCT URINALYSIS DIP (DEVICE)
BILIRUBIN URINE: NEGATIVE
Glucose, UA: NEGATIVE mg/dL
Hgb urine dipstick: NEGATIVE
Ketones, ur: NEGATIVE mg/dL
Nitrite: NEGATIVE
Protein, ur: NEGATIVE mg/dL
SPECIFIC GRAVITY, URINE: 1.015 (ref 1.005–1.030)
Urobilinogen, UA: 0.2 mg/dL (ref 0.0–1.0)
pH: 7 (ref 5.0–8.0)

## 2013-11-16 LAB — OB RESULTS CONSOLE GBS: GBS: NEGATIVE

## 2013-11-16 NOTE — Progress Notes (Signed)
No questions or concerns.  Obtained GBS and GC/CT today.

## 2013-11-16 NOTE — Telephone Encounter (Signed)
Joy from [employer] left message she is calling on behalf of [place] who is under care of Sharen Counter and is on maternity leave. She needs to verify information for Short term disability claim.  States if we call her with information by phone will not need to do paperwork.  Need Diagnosis including ICD code, tests that confirmed that, treatment plan , medications, dates patient seen, and how patient is doing.

## 2013-11-17 NOTE — Telephone Encounter (Signed)
Patient has not filled out ROI. Called patient, no answer- left message that we are trying to reach you, please call us back

## 2013-11-18 LAB — GC/CHLAMYDIA PROBE AMP
CT Probe RNA: NEGATIVE
GC PROBE AMP APTIMA: NEGATIVE

## 2013-11-19 LAB — CULTURE, BETA STREP (GROUP B ONLY)

## 2013-11-20 ENCOUNTER — Encounter: Payer: Self-pay | Admitting: Family

## 2013-11-21 NOTE — Telephone Encounter (Signed)
Patient has appointment this week. Will ask her to sign ROI at that time.

## 2013-11-23 ENCOUNTER — Encounter: Payer: Self-pay | Admitting: Family Medicine

## 2013-11-23 ENCOUNTER — Ambulatory Visit (INDEPENDENT_AMBULATORY_CARE_PROVIDER_SITE_OTHER): Payer: BC Managed Care – PPO | Admitting: Family Medicine

## 2013-11-23 VITALS — BP 122/79 | HR 88 | Temp 97.2°F | Wt 130.9 lb

## 2013-11-23 DIAGNOSIS — O0932 Supervision of pregnancy with insufficient antenatal care, second trimester: Secondary | ICD-10-CM

## 2013-11-23 DIAGNOSIS — O0930 Supervision of pregnancy with insufficient antenatal care, unspecified trimester: Secondary | ICD-10-CM

## 2013-11-23 LAB — POCT URINALYSIS DIP (DEVICE)
Bilirubin Urine: NEGATIVE
GLUCOSE, UA: NEGATIVE mg/dL
Hgb urine dipstick: NEGATIVE
Ketones, ur: NEGATIVE mg/dL
NITRITE: NEGATIVE
PROTEIN: NEGATIVE mg/dL
SPECIFIC GRAVITY, URINE: 1.01 (ref 1.005–1.030)
UROBILINOGEN UA: 0.2 mg/dL (ref 0.0–1.0)
pH: 6.5 (ref 5.0–8.0)

## 2013-11-23 NOTE — Progress Notes (Signed)
+  FM, no lof, no vb, occ ctx  Hx of Afib: on no meds. RRR   Sherri Buchanan is a 28 y.o. G1P0000 at [redacted]w[redacted]d by L=11 here for ROB visit.  Discussed with Patient:  -Plans to breast feed.  All questions answered. -Continue prenatal vitamins. -Reviewed fetal kick counts Pt to perform daily at a time when the baby is active, lie laterally with both hands on belly in quiet room and count all movements (hiccups, shoulder rolls, obvious kicks, etc); pt is to report to clinic L&D for less than 10 movements felt in a one hour time period-pt told as soon as she counts 10 movements the count is complete.  - Routine precautions discussed (depression, infection s/s).   Patient provided with all pertinent phone numbers for emergencies. - RTC for any VB, regular, painful cramps/ctxs occurring at a rate of >2/10 min, fever (100.5 or higher), n/v/d, any pain that is unresolving or worsening, LOF, decreased fetal movement, CP, SOB, edema - RTC in 1 weeks for next appt.   Problems: Patient Active Problem List   Diagnosis Date Noted  . Acid reflux 08/23/2013  . Late prenatal care complicating pregnancy in second trimester 07/13/2013    To Do:   [ ]  Vaccines: recd [ ]  BCM: considering Mirena [ ]  Readiness: baby has a place to sleep, car seat, other baby necessities.  Edu: [ x] PTL precautions; [ ]  BF class; [ ]  childbirth class; [ ]   BF counseling;

## 2013-11-23 NOTE — Patient Instructions (Signed)
Third Trimester of Pregnancy  The third trimester is from week 29 through week 42, months 7 through 9. The third trimester is a time when the fetus is growing rapidly. At the end of the ninth month, the fetus is about 20 inches in length and weighs 6 10 pounds.   BODY CHANGES  Your body goes through many changes during pregnancy. The changes vary from woman to woman.    Your weight will continue to increase. You can expect to gain 25 35 pounds (11 16 kg) by the end of the pregnancy.   You may begin to get stretch marks on your hips, abdomen, and breasts.   You may urinate more often because the fetus is moving lower into your pelvis and pressing on your bladder.   You may develop or continue to have heartburn as a result of your pregnancy.   You may develop constipation because certain hormones are causing the muscles that push waste through your intestines to slow down.   You may develop hemorrhoids or swollen, bulging veins (varicose veins).   You may have pelvic pain because of the weight gain and pregnancy hormones relaxing your joints between the bones in your pelvis. Back aches may result from over exertion of the muscles supporting your posture.   Your breasts will continue to grow and be tender. A yellow discharge may leak from your breasts called colostrum.   Your belly button may stick out.   You may feel short of breath because of your expanding uterus.   You may notice the fetus "dropping," or moving lower in your abdomen.   You may have a bloody mucus discharge. This usually occurs a few days to a week before labor begins.   Your cervix becomes thin and soft (effaced) near your due date.  WHAT TO EXPECT AT YOUR PRENATAL EXAMS   You will have prenatal exams every 2 weeks until week 36. Then, you will have weekly prenatal exams. During a routine prenatal visit:   You will be weighed to make sure you and the fetus are growing normally.   Your blood pressure is taken.   Your abdomen will be  measured to track your baby's growth.   The fetal heartbeat will be listened to.   Any test results from the previous visit will be discussed.   You may have a cervical check near your due date to see if you have effaced.  At around 36 weeks, your caregiver will check your cervix. At the same time, your caregiver will also perform a test on the secretions of the vaginal tissue. This test is to determine if a type of bacteria, Group B streptococcus, is present. Your caregiver will explain this further.  Your caregiver may ask you:   What your birth plan is.   How you are feeling.   If you are feeling the baby move.   If you have had any abnormal symptoms, such as leaking fluid, bleeding, severe headaches, or abdominal cramping.   If you have any questions.  Other tests or screenings that may be performed during your third trimester include:   Blood tests that check for low iron levels (anemia).   Fetal testing to check the health, activity level, and growth of the fetus. Testing is done if you have certain medical conditions or if there are problems during the pregnancy.  FALSE LABOR  You may feel small, irregular contractions that eventually go away. These are called Braxton Hicks contractions, or   false labor. Contractions may last for hours, days, or even weeks before true labor sets in. If contractions come at regular intervals, intensify, or become painful, it is best to be seen by your caregiver.   SIGNS OF LABOR    Menstrual-like cramps.   Contractions that are 5 minutes apart or less.   Contractions that start on the top of the uterus and spread down to the lower abdomen and back.   A sense of increased pelvic pressure or back pain.   A watery or bloody mucus discharge that comes from the vagina.  If you have any of these signs before the 37th week of pregnancy, call your caregiver right away. You need to go to the hospital to get checked immediately.  HOME CARE INSTRUCTIONS    Avoid all  smoking, herbs, alcohol, and unprescribed drugs. These chemicals affect the formation and growth of the baby.   Follow your caregiver's instructions regarding medicine use. There are medicines that are either safe or unsafe to take during pregnancy.   Exercise only as directed by your caregiver. Experiencing uterine cramps is a good sign to stop exercising.   Continue to eat regular, healthy meals.   Wear a good support bra for breast tenderness.   Do not use hot tubs, steam rooms, or saunas.   Wear your seat belt at all times when driving.   Avoid raw meat, uncooked cheese, cat litter boxes, and soil used by cats. These carry germs that can cause birth defects in the baby.   Take your prenatal vitamins.   Try taking a stool softener (if your caregiver approves) if you develop constipation. Eat more high-fiber foods, such as fresh vegetables or fruit and whole grains. Drink plenty of fluids to keep your urine clear or pale yellow.   Take warm sitz baths to soothe any pain or discomfort caused by hemorrhoids. Use hemorrhoid cream if your caregiver approves.   If you develop varicose veins, wear support hose. Elevate your feet for 15 minutes, 3 4 times a day. Limit salt in your diet.   Avoid heavy lifting, wear low heal shoes, and practice good posture.   Rest a lot with your legs elevated if you have leg cramps or low back pain.   Visit your dentist if you have not gone during your pregnancy. Use a soft toothbrush to brush your teeth and be gentle when you floss.   A sexual relationship may be continued unless your caregiver directs you otherwise.   Do not travel far distances unless it is absolutely necessary and only with the approval of your caregiver.   Take prenatal classes to understand, practice, and ask questions about the labor and delivery.   Make a trial run to the hospital.   Pack your hospital bag.   Prepare the baby's nursery.   Continue to go to all your prenatal visits as directed  by your caregiver.  SEEK MEDICAL CARE IF:   You are unsure if you are in labor or if your water has broken.   You have dizziness.   You have mild pelvic cramps, pelvic pressure, or nagging pain in your abdominal area.   You have persistent nausea, vomiting, or diarrhea.   You have a bad smelling vaginal discharge.   You have pain with urination.  SEEK IMMEDIATE MEDICAL CARE IF:    You have a fever.   You are leaking fluid from your vagina.   You have spotting or bleeding from your vagina.     You have severe abdominal cramping or pain.   You have rapid weight loss or gain.   You have shortness of breath with chest pain.   You notice sudden or extreme swelling of your face, hands, ankles, feet, or legs.   You have not felt your baby move in over an hour.   You have severe headaches that do not go away with medicine.   You have vision changes.  Document Released: 06/03/2001 Document Revised: 02/09/2013 Document Reviewed: 08/10/2012  [employer] Patient Information 2014 [employer].

## 2013-11-24 ENCOUNTER — Encounter: Payer: Self-pay | Admitting: *Deleted

## 2013-12-06 ENCOUNTER — Encounter: Payer: Self-pay | Admitting: Obstetrics and Gynecology

## 2013-12-06 ENCOUNTER — Ambulatory Visit (INDEPENDENT_AMBULATORY_CARE_PROVIDER_SITE_OTHER): Payer: BC Managed Care – PPO | Admitting: Obstetrics and Gynecology

## 2013-12-06 VITALS — BP 120/78 | HR 76 | Temp 97.6°F | Wt 130.5 lb

## 2013-12-06 DIAGNOSIS — O0930 Supervision of pregnancy with insufficient antenatal care, unspecified trimester: Secondary | ICD-10-CM

## 2013-12-06 DIAGNOSIS — O0932 Supervision of pregnancy with insufficient antenatal care, second trimester: Secondary | ICD-10-CM

## 2013-12-06 LAB — POCT URINALYSIS DIP (DEVICE)
BILIRUBIN URINE: NEGATIVE
Glucose, UA: NEGATIVE mg/dL
HGB URINE DIPSTICK: NEGATIVE
Ketones, ur: NEGATIVE mg/dL
NITRITE: NEGATIVE
Protein, ur: NEGATIVE mg/dL
Specific Gravity, Urine: 1.01 (ref 1.005–1.030)
UROBILINOGEN UA: 0.2 mg/dL (ref 0.0–1.0)
pH: 7 (ref 5.0–8.0)

## 2013-12-06 NOTE — Progress Notes (Signed)
Doing well. Hx afib. ROI records. No CV sx.  Had bloody show today. Rare UC. Good FM. S/sx labor and plans reviewed.  Mod LE> C&S

## 2013-12-06 NOTE — Patient Instructions (Signed)
Third Trimester of Pregnancy  The third trimester is from week 29 through week 42, months 7 through 9. The third trimester is a time when the fetus is growing rapidly. At the end of the ninth month, the fetus is about 20 inches in length and weighs 6 10 pounds.   BODY CHANGES  Your body goes through many changes during pregnancy. The changes vary from woman to woman.    Your weight will continue to increase. You can expect to gain 25 35 pounds (11 16 kg) by the end of the pregnancy.   You may begin to get stretch marks on your hips, abdomen, and breasts.   You may urinate more often because the fetus is moving lower into your pelvis and pressing on your bladder.   You may develop or continue to have heartburn as a result of your pregnancy.   You may develop constipation because certain hormones are causing the muscles that push waste through your intestines to slow down.   You may develop hemorrhoids or swollen, bulging veins (varicose veins).   You may have pelvic pain because of the weight gain and pregnancy hormones relaxing your joints between the bones in your pelvis. Back aches may result from over exertion of the muscles supporting your posture.   Your breasts will continue to grow and be tender. A yellow discharge may leak from your breasts called colostrum.   Your belly button may stick out.   You may feel short of breath because of your expanding uterus.   You may notice the fetus "dropping," or moving lower in your abdomen.   You may have a bloody mucus discharge. This usually occurs a few days to a week before labor begins.   Your cervix becomes thin and soft (effaced) near your due date.  WHAT TO EXPECT AT YOUR PRENATAL EXAMS   You will have prenatal exams every 2 weeks until week 36. Then, you will have weekly prenatal exams. During a routine prenatal visit:   You will be weighed to make sure you and the fetus are growing normally.   Your blood pressure is taken.   Your abdomen will be  measured to track your baby's growth.   The fetal heartbeat will be listened to.   Any test results from the previous visit will be discussed.   You may have a cervical check near your due date to see if you have effaced.  At around 36 weeks, your caregiver will check your cervix. At the same time, your caregiver will also perform a test on the secretions of the vaginal tissue. This test is to determine if a type of bacteria, Group B streptococcus, is present. Your caregiver will explain this further.  Your caregiver may ask you:   What your birth plan is.   How you are feeling.   If you are feeling the baby move.   If you have had any abnormal symptoms, such as leaking fluid, bleeding, severe headaches, or abdominal cramping.   If you have any questions.  Other tests or screenings that may be performed during your third trimester include:   Blood tests that check for low iron levels (anemia).   Fetal testing to check the health, activity level, and growth of the fetus. Testing is done if you have certain medical conditions or if there are problems during the pregnancy.  FALSE LABOR  You may feel small, irregular contractions that eventually go away. These are called Braxton Hicks contractions, or   false labor. Contractions may last for hours, days, or even weeks before true labor sets in. If contractions come at regular intervals, intensify, or become painful, it is best to be seen by your caregiver.   SIGNS OF LABOR    Menstrual-like cramps.   Contractions that are 5 minutes apart or less.   Contractions that start on the top of the uterus and spread down to the lower abdomen and back.   A sense of increased pelvic pressure or back pain.   A watery or bloody mucus discharge that comes from the vagina.  If you have any of these signs before the 37th week of pregnancy, call your caregiver right away. You need to go to the hospital to get checked immediately.  HOME CARE INSTRUCTIONS    Avoid all  smoking, herbs, alcohol, and unprescribed drugs. These chemicals affect the formation and growth of the baby.   Follow your caregiver's instructions regarding medicine use. There are medicines that are either safe or unsafe to take during pregnancy.   Exercise only as directed by your caregiver. Experiencing uterine cramps is a good sign to stop exercising.   Continue to eat regular, healthy meals.   Wear a good support bra for breast tenderness.   Do not use hot tubs, steam rooms, or saunas.   Wear your seat belt at all times when driving.   Avoid raw meat, uncooked cheese, cat litter boxes, and soil used by cats. These carry germs that can cause birth defects in the baby.   Take your prenatal vitamins.   Try taking a stool softener (if your caregiver approves) if you develop constipation. Eat more high-fiber foods, such as fresh vegetables or fruit and whole grains. Drink plenty of fluids to keep your urine clear or pale yellow.   Take warm sitz baths to soothe any pain or discomfort caused by hemorrhoids. Use hemorrhoid cream if your caregiver approves.   If you develop varicose veins, wear support hose. Elevate your feet for 15 minutes, 3 4 times a day. Limit salt in your diet.   Avoid heavy lifting, wear low heal shoes, and practice good posture.   Rest a lot with your legs elevated if you have leg cramps or low back pain.   Visit your dentist if you have not gone during your pregnancy. Use a soft toothbrush to brush your teeth and be gentle when you floss.   A sexual relationship may be continued unless your caregiver directs you otherwise.   Do not travel far distances unless it is absolutely necessary and only with the approval of your caregiver.   Take prenatal classes to understand, practice, and ask questions about the labor and delivery.   Make a trial run to the hospital.   Pack your hospital bag.   Prepare the baby's nursery.   Continue to go to all your prenatal visits as directed  by your caregiver.  SEEK MEDICAL CARE IF:   You are unsure if you are in labor or if your water has broken.   You have dizziness.   You have mild pelvic cramps, pelvic pressure, or nagging pain in your abdominal area.   You have persistent nausea, vomiting, or diarrhea.   You have a bad smelling vaginal discharge.   You have pain with urination.  SEEK IMMEDIATE MEDICAL CARE IF:    You have a fever.   You are leaking fluid from your vagina.   You have spotting or bleeding from your vagina.     You have severe abdominal cramping or pain.   You have rapid weight loss or gain.   You have shortness of breath with chest pain.   You notice sudden or extreme swelling of your face, hands, ankles, feet, or legs.   You have not felt your baby move in over an hour.   You have severe headaches that do not go away with medicine.   You have vision changes.  Document Released: 06/03/2001 Document Revised: 02/09/2013 Document Reviewed: 08/10/2012  [employer] Patient Information 2014 [employer].

## 2013-12-06 NOTE — Progress Notes (Signed)
Patient noticed mucous/blood tinged discharge when wiping this afternoon.

## 2013-12-13 ENCOUNTER — Ambulatory Visit (INDEPENDENT_AMBULATORY_CARE_PROVIDER_SITE_OTHER): Payer: BC Managed Care – PPO | Admitting: Advanced Practice Midwife

## 2013-12-13 VITALS — BP 120/81 | HR 78 | Temp 97.2°F | Wt 129.7 lb

## 2013-12-13 DIAGNOSIS — O3090 Multiple gestation, unspecified, unspecified trimester: Secondary | ICD-10-CM

## 2013-12-13 DIAGNOSIS — O368190 Decreased fetal movements, unspecified trimester, not applicable or unspecified: Secondary | ICD-10-CM

## 2013-12-13 DIAGNOSIS — O368131 Decreased fetal movements, third trimester, fetus 1: Secondary | ICD-10-CM

## 2013-12-13 LAB — POCT URINALYSIS DIP (DEVICE)
BILIRUBIN URINE: NEGATIVE
Glucose, UA: NEGATIVE mg/dL
Hgb urine dipstick: NEGATIVE
Ketones, ur: NEGATIVE mg/dL
NITRITE: NEGATIVE
PH: 7 (ref 5.0–8.0)
Protein, ur: NEGATIVE mg/dL
Specific Gravity, Urine: 1.01 (ref 1.005–1.030)
Urobilinogen, UA: 0.2 mg/dL (ref 0.0–1.0)

## 2013-12-13 NOTE — Progress Notes (Signed)
Doing well. NST done today for decreased movement >> reactive. Irregular mild contractions. Labor precautions reviewed.

## 2013-12-13 NOTE — Progress Notes (Signed)
Patient reports some pelvic pressure; feels like baby has been moving less for about a week

## 2013-12-13 NOTE — Patient Instructions (Signed)
Third Trimester of Pregnancy The third trimester is from week 29 through week 42, months 7 through 9. The third trimester is a time when the fetus is growing rapidly. At the end of the ninth month, the fetus is about 20 inches in length and weighs 6-10 pounds.  BODY CHANGES Your body goes through many changes during pregnancy. The changes vary from woman to woman.   Your weight will continue to increase. You can expect to gain 25-35 pounds (11-16 kg) by the end of the pregnancy.  You may begin to get stretch marks on your hips, abdomen, and breasts.  You may urinate more often because the fetus is moving lower into your pelvis and pressing on your bladder.  You may develop or continue to have heartburn as a result of your pregnancy.  You may develop constipation because certain hormones are causing the muscles that push waste through your intestines to slow down.  You may develop hemorrhoids or swollen, bulging veins (varicose veins).  You may have pelvic pain because of the weight gain and pregnancy hormones relaxing your joints between the bones in your pelvis. Backaches may result from overexertion of the muscles supporting your posture.  You may have changes in your hair. These can include thickening of your hair, rapid growth, and changes in texture. Some women also have hair loss during or after pregnancy, or hair that feels dry or thin. Your hair will most likely return to normal after your baby is born.  Your breasts will continue to grow and be tender. A yellow discharge may leak from your breasts called colostrum.  Your belly button may stick out.  You may feel short of breath because of your expanding uterus.  You may notice the fetus "dropping," or moving lower in your abdomen.  You may have a bloody mucus discharge. This usually occurs a few days to a week before labor begins.  Your cervix becomes thin and soft (effaced) near your due date. WHAT TO EXPECT AT YOUR PRENATAL  EXAMS  You will have prenatal exams every 2 weeks until week 36. Then, you will have weekly prenatal exams. During a routine prenatal visit:  You will be weighed to make sure you and the fetus are growing normally.  Your blood pressure is taken.  Your abdomen will be measured to track your baby's growth.  The fetal heartbeat will be listened to.  Any test results from the previous visit will be discussed.  You may have a cervical check near your due date to see if you have effaced. At around 36 weeks, your caregiver will check your cervix. At the same time, your caregiver will also perform a test on the secretions of the vaginal tissue. This test is to determine if a type of bacteria, Group B streptococcus, is present. Your caregiver will explain this further. Your caregiver may ask you:  What your birth plan is.  How you are feeling.  If you are feeling the baby move.  If you have had any abnormal symptoms, such as leaking fluid, bleeding, severe headaches, or abdominal cramping.  If you have any questions. Other tests or screenings that may be performed during your third trimester include:  Blood tests that check for low iron levels (anemia).  Fetal testing to check the health, activity level, and growth of the fetus. Testing is done if you have certain medical conditions or if there are problems during the pregnancy. FALSE LABOR You may feel small, irregular contractions that   eventually go away. These are called Braxton Hicks contractions, or false labor. Contractions may last for hours, days, or even weeks before true labor sets in. If contractions come at regular intervals, intensify, or become painful, it is best to be seen by your caregiver.  SIGNS OF LABOR   Menstrual-like cramps.  Contractions that are 5 minutes apart or less.  Contractions that start on the top of the uterus and spread down to the lower abdomen and back.  A sense of increased pelvic pressure or back  pain.  A watery or bloody mucus discharge that comes from the vagina. If you have any of these signs before the 37th week of pregnancy, call your caregiver right away. You need to go to the hospital to get checked immediately. HOME CARE INSTRUCTIONS   Avoid all smoking, herbs, alcohol, and unprescribed drugs. These chemicals affect the formation and growth of the baby.  Follow your caregiver's instructions regarding medicine use. There are medicines that are either safe or unsafe to take during pregnancy.  Exercise only as directed by your caregiver. Experiencing uterine cramps is a good sign to stop exercising.  Continue to eat regular, healthy meals.  Wear a good support bra for breast tenderness.  Do not use hot tubs, steam rooms, or saunas.  Wear your seat belt at all times when driving.  Avoid raw meat, uncooked cheese, cat litter boxes, and soil used by cats. These carry germs that can cause birth defects in the baby.  Take your prenatal vitamins.  Try taking a stool softener (if your caregiver approves) if you develop constipation. Eat more high-fiber foods, such as fresh vegetables or fruit and whole grains. Drink plenty of fluids to keep your urine clear or pale yellow.  Take warm sitz baths to soothe any pain or discomfort caused by hemorrhoids. Use hemorrhoid cream if your caregiver approves.  If you develop varicose veins, wear support hose. Elevate your feet for 15 minutes, 3-4 times a day. Limit salt in your diet.  Avoid heavy lifting, wear low heal shoes, and practice good posture.  Rest a lot with your legs elevated if you have leg cramps or low back pain.  Visit your dentist if you have not gone during your pregnancy. Use a soft toothbrush to brush your teeth and be gentle when you floss.  A sexual relationship may be continued unless your caregiver directs you otherwise.  Do not travel far distances unless it is absolutely necessary and only with the approval  of your caregiver.  Take prenatal classes to understand, practice, and ask questions about the labor and delivery.  Make a trial run to the hospital.  Pack your hospital bag.  Prepare the baby's nursery.  Continue to go to all your prenatal visits as directed by your caregiver. SEEK MEDICAL CARE IF:  You are unsure if you are in labor or if your water has broken.  You have dizziness.  You have mild pelvic cramps, pelvic pressure, or nagging pain in your abdominal area.  You have persistent nausea, vomiting, or diarrhea.  You have a bad smelling vaginal discharge.  You have pain with urination. SEEK IMMEDIATE MEDICAL CARE IF:   You have a fever.  You are leaking fluid from your vagina.  You have spotting or bleeding from your vagina.  You have severe abdominal cramping or pain.  You have rapid weight loss or gain.  You have shortness of breath with chest pain.  You notice sudden or extreme swelling   of your face, hands, ankles, feet, or legs.  You have not felt your baby move in over an hour.  You have severe headaches that do not go away with medicine.  You have vision changes. Document Released: 06/03/2001 Document Revised: 06/14/2013 Document Reviewed: 08/10/2012 Exit[employer] Patient Information 2015 [employer]. This information is not intended to replace advice given to you by your health care provider. Make sure you discuss any questions you have with your health care provider.  

## 2013-12-20 ENCOUNTER — Ambulatory Visit (INDEPENDENT_AMBULATORY_CARE_PROVIDER_SITE_OTHER): Payer: BC Managed Care – PPO | Admitting: Obstetrics and Gynecology

## 2013-12-20 ENCOUNTER — Encounter: Payer: Self-pay | Admitting: Obstetrics and Gynecology

## 2013-12-20 VITALS — BP 130/82 | HR 79 | Wt 130.2 lb

## 2013-12-20 DIAGNOSIS — O480 Post-term pregnancy: Secondary | ICD-10-CM

## 2013-12-20 DIAGNOSIS — Z3480 Encounter for supervision of other normal pregnancy, unspecified trimester: Secondary | ICD-10-CM

## 2013-12-20 DIAGNOSIS — O0932 Supervision of pregnancy with insufficient antenatal care, second trimester: Secondary | ICD-10-CM

## 2013-12-20 LAB — POCT URINALYSIS DIP (DEVICE)
Bilirubin Urine: NEGATIVE
GLUCOSE, UA: NEGATIVE mg/dL
Hgb urine dipstick: NEGATIVE
Ketones, ur: NEGATIVE mg/dL
Nitrite: NEGATIVE
Protein, ur: NEGATIVE mg/dL
Specific Gravity, Urine: 1.01 (ref 1.005–1.030)
UROBILINOGEN UA: 0.2 mg/dL (ref 0.0–1.0)
pH: 7 (ref 5.0–8.0)

## 2013-12-20 NOTE — Progress Notes (Signed)
Patient is doing well without complaints. FM/labor precautions reviewed. Will schedule IOL on 7/6. Patient to return on 7/2 for AFI. All questions answered NST reviewed and reactive.

## 2013-12-21 ENCOUNTER — Telehealth (HOSPITAL_COMMUNITY): Payer: Self-pay | Admitting: *Deleted

## 2013-12-21 NOTE — Telephone Encounter (Signed)
Preadmission screen  

## 2013-12-22 ENCOUNTER — Encounter (HOSPITAL_COMMUNITY): Payer: BC Managed Care – PPO | Admitting: Anesthesiology

## 2013-12-22 ENCOUNTER — Encounter (HOSPITAL_COMMUNITY): Payer: Self-pay | Admitting: *Deleted

## 2013-12-22 ENCOUNTER — Inpatient Hospital Stay (HOSPITAL_COMMUNITY): Payer: BC Managed Care – PPO | Admitting: Anesthesiology

## 2013-12-22 ENCOUNTER — Other Ambulatory Visit: Payer: BC Managed Care – PPO

## 2013-12-22 ENCOUNTER — Inpatient Hospital Stay (HOSPITAL_COMMUNITY)
Admission: AD | Admit: 2013-12-22 | Discharge: 2013-12-24 | DRG: 774 | Disposition: A | Discharge status: Home / Self Care | Payer: BC Managed Care – PPO | Source: Ambulatory Visit | Attending: Obstetrics & Gynecology | Admitting: Obstetrics & Gynecology

## 2013-12-22 DIAGNOSIS — Z3490 Encounter for supervision of normal pregnancy, unspecified, unspecified trimester: Secondary | ICD-10-CM

## 2013-12-22 DIAGNOSIS — I4891 Unspecified atrial fibrillation: Secondary | ICD-10-CM | POA: Diagnosis present

## 2013-12-22 DIAGNOSIS — O0932 Supervision of pregnancy with insufficient antenatal care, second trimester: Secondary | ICD-10-CM

## 2013-12-22 DIAGNOSIS — O479 False labor, unspecified: Secondary | ICD-10-CM | POA: Diagnosis present

## 2013-12-22 DIAGNOSIS — Z8249 Family history of ischemic heart disease and other diseases of the circulatory system: Secondary | ICD-10-CM

## 2013-12-22 DIAGNOSIS — IMO0001 Reserved for inherently not codable concepts without codable children: Secondary | ICD-10-CM

## 2013-12-22 LAB — CBC
HEMATOCRIT: 33.3 % — AB (ref 36.0–46.0)
Hemoglobin: 11.1 g/dL — ABNORMAL LOW (ref 12.0–15.0)
MCH: 25.6 pg — ABNORMAL LOW (ref 26.0–34.0)
MCHC: 33.3 g/dL (ref 30.0–36.0)
MCV: 76.7 fL — ABNORMAL LOW (ref 78.0–100.0)
Platelets: 217 10*3/uL (ref 150–400)
RBC: 4.34 MIL/uL (ref 3.87–5.11)
RDW: 15.5 % (ref 11.5–15.5)
WBC: 10.7 10*3/uL — AB (ref 4.0–10.5)

## 2013-12-22 LAB — RPR

## 2013-12-22 MED ORDER — CITRIC ACID-SODIUM CITRATE 334-500 MG/5ML PO SOLN: 30.0000 mL | ORAL | Status: DC | PRN

## 2013-12-22 MED ORDER — TETANUS-DIPHTH-ACELL PERTUSSIS 5-2.5-18.5 LF-MCG/0.5 IM SUSP: 0.5000 mL | Freq: Once | INTRAMUSCULAR | Status: DC

## 2013-12-22 MED ORDER — ONDANSETRON HCL 4 MG/2ML IJ SOLN: 4.0000 mg | INTRAMUSCULAR | Status: DC | PRN

## 2013-12-22 MED ORDER — OXYCODONE-ACETAMINOPHEN 5-325 MG PO TABS: 1.0000 | ORAL_TABLET | ORAL | Status: DC | PRN

## 2013-12-22 MED ORDER — OXYTOCIN BOLUS FROM INFUSION: 500.0000 mL | INTRAVENOUS | Status: DC

## 2013-12-22 MED ORDER — ACETAMINOPHEN 325 MG PO TABS: 650.0000 mg | ORAL_TABLET | ORAL | Status: DC | PRN

## 2013-12-22 MED ORDER — LACTATED RINGERS IV SOLN
500.0000 mL | Freq: Once | INTRAVENOUS | Status: AC
  Administered 2013-12-22: 500 mL via INTRAVENOUS

## 2013-12-22 MED ORDER — IBUPROFEN 600 MG PO TABS
600.0000 mg | ORAL_TABLET | Freq: Four times a day (QID) | ORAL | Status: DC | PRN
  Administered 2013-12-22: 600 mg via ORAL
  Filled 2013-12-22: qty 1

## 2013-12-22 MED ORDER — SIMETHICONE 80 MG PO CHEW: 80.0000 mg | CHEWABLE_TABLET | ORAL | Status: DC | PRN

## 2013-12-22 MED ORDER — PHENYLEPHRINE 40 MCG/ML (10ML) SYRINGE FOR IV PUSH (FOR BLOOD PRESSURE SUPPORT)
80.0000 ug | PREFILLED_SYRINGE | INTRAVENOUS | Status: DC | PRN
  Filled 2013-12-22: qty 2

## 2013-12-22 MED ORDER — FENTANYL CITRATE 0.05 MG/ML IJ SOLN: 100.0000 ug | Freq: Once | INTRAMUSCULAR | Status: DC

## 2013-12-22 MED ORDER — LIDOCAINE HCL (PF) 1 % IJ SOLN
30.0000 mL | INTRAMUSCULAR | Status: DC | PRN
  Filled 2013-12-22: qty 30

## 2013-12-22 MED ORDER — ONDANSETRON HCL 4 MG PO TABS
4.0000 mg | ORAL_TABLET | ORAL | Status: DC | PRN
Start: 2013-12-22 — End: 2013-12-24

## 2013-12-22 MED ORDER — SENNOSIDES-DOCUSATE SODIUM 8.6-50 MG PO TABS
2.0000 | ORAL_TABLET | ORAL | Status: DC
  Administered 2013-12-22: 2 via ORAL
  Filled 2013-12-22: qty 2

## 2013-12-22 MED ORDER — DIPHENHYDRAMINE HCL 25 MG PO CAPS: 25.0000 mg | ORAL_CAPSULE | Freq: Four times a day (QID) | ORAL | Status: DC | PRN

## 2013-12-22 MED ORDER — DIBUCAINE 1 % RE OINT
1.0000 | TOPICAL_OINTMENT | RECTAL | Status: DC | PRN
Start: 2013-12-22 — End: 2013-12-24

## 2013-12-22 MED ORDER — WITCH HAZEL-GLYCERIN EX PADS: 1.0000 "application " | MEDICATED_PAD | CUTANEOUS | Status: DC | PRN

## 2013-12-22 MED ORDER — FENTANYL 2.5 MCG/ML BUPIVACAINE 1/10 % EPIDURAL INFUSION (WH - ANES)
14.0000 mL/h | INTRAMUSCULAR | Status: DC | PRN
  Administered 2013-12-22: 14 mL/h via EPIDURAL
  Filled 2013-12-22: qty 125

## 2013-12-22 MED ORDER — PHENYLEPHRINE 40 MCG/ML (10ML) SYRINGE FOR IV PUSH (FOR BLOOD PRESSURE SUPPORT)
80.0000 ug | PREFILLED_SYRINGE | INTRAVENOUS | Status: DC | PRN
  Filled 2013-12-22: qty 10
  Filled 2013-12-22: qty 2

## 2013-12-22 MED ORDER — ZOLPIDEM TARTRATE 5 MG PO TABS: 5.0000 mg | ORAL_TABLET | Freq: Every evening | ORAL | Status: DC | PRN

## 2013-12-22 MED ORDER — PRENATAL MULTIVITAMIN CH
1.0000 | ORAL_TABLET | Freq: Every day | ORAL | Status: DC
  Administered 2013-12-23: 1 via ORAL
  Filled 2013-12-22: qty 1

## 2013-12-22 MED ORDER — ONDANSETRON HCL 4 MG/2ML IJ SOLN: 4.0000 mg | Freq: Four times a day (QID) | INTRAMUSCULAR | Status: DC | PRN

## 2013-12-22 MED ORDER — EPHEDRINE 5 MG/ML INJ
10.0000 mg | INTRAVENOUS | Status: DC | PRN
  Filled 2013-12-22: qty 2

## 2013-12-22 MED ORDER — LIDOCAINE HCL (PF) 1 % IJ SOLN
INTRAMUSCULAR | Status: DC | PRN
  Administered 2013-12-22: 10 mL

## 2013-12-22 MED ORDER — IBUPROFEN 600 MG PO TABS
600.0000 mg | ORAL_TABLET | Freq: Four times a day (QID) | ORAL | Status: DC
  Administered 2013-12-22 – 2013-12-24 (×6): 600 mg via ORAL
  Filled 2013-12-22 (×6): qty 1

## 2013-12-22 MED ORDER — OXYTOCIN 40 UNITS IN LACTATED RINGERS INFUSION - SIMPLE MED
62.5000 mL/h | INTRAVENOUS | Status: DC
  Administered 2013-12-22: 62.5 mL/h via INTRAVENOUS
  Filled 2013-12-22: qty 1000

## 2013-12-22 MED ORDER — FENTANYL 2.5 MCG/ML BUPIVACAINE 1/10 % EPIDURAL INFUSION (WH - ANES)
14.0000 mL/h | INTRAMUSCULAR | Status: DC | PRN
  Administered 2013-12-22: 14 mL/h via EPIDURAL

## 2013-12-22 MED ORDER — MEASLES, MUMPS & RUBELLA VAC ~~LOC~~ INJ: 0.5000 mL | INJECTION | Freq: Once | SUBCUTANEOUS | Status: DC

## 2013-12-22 MED ORDER — DIPHENHYDRAMINE HCL 50 MG/ML IJ SOLN: 12.5000 mg | INTRAMUSCULAR | Status: DC | PRN

## 2013-12-22 MED ORDER — LACTATED RINGERS IV SOLN
500.0000 mL | INTRAVENOUS | Status: DC | PRN
Start: 2013-12-22 — End: 2013-12-22

## 2013-12-22 MED ORDER — BENZOCAINE-MENTHOL 20-0.5 % EX AERO
1.0000 "application " | INHALATION_SPRAY | CUTANEOUS | Status: DC | PRN
  Filled 2013-12-22: qty 56

## 2013-12-22 MED ORDER — LANOLIN HYDROUS EX OINT: TOPICAL_OINTMENT | CUTANEOUS | Status: DC | PRN

## 2013-12-22 MED ORDER — FLEET ENEMA 7-19 GM/118ML RE ENEM
1.0000 | ENEMA | RECTAL | Status: DC | PRN
Start: 2013-12-22 — End: 2013-12-22

## 2013-12-22 MED ORDER — LACTATED RINGERS IV SOLN
INTRAVENOUS | Status: DC
  Administered 2013-12-22 (×2): via INTRAVENOUS

## 2013-12-22 NOTE — H&P (Signed)
Sherri Buchanan is a 28 y.o. female presenting for contractions that started at 0200 this AM.  Denies ROM, +blood tinged mucus. Pt received prenatal care in the Low Risk clinic beginning at 17 wks IUP.  Pregnancy dated by LMP consistent with 11 wk ultrasound.  Pregnancy uncomplicated other than late to care.  Medical history consists of being diagnosed with atrial fibrillation in 2010.  Pt states began taking metoprolol.  Discontinued medication on her own and has not had an episode or has seen a cardiologist since 2012.  History OB History   Grav Para Term Preterm Abortions TAB SAB Ect Mult Living   1 0 0 0 0 0 0 0 0 0      Past Medical History  Diagnosis Date  . Atrial fibrillation    Past Surgical History  Procedure Laterality Date  . No past surgeries     Family History: family history includes Cancer in her maternal aunt, maternal grandmother, and mother; Heart disease in her mother; Hypertension in her brother, father, and sister; Seizures in her father. Social History:  reports that she has never smoked. She has never used smokeless tobacco. She reports that she does not drink alcohol or use illicit drugs.   Review of Systems  Gastrointestinal: Positive for abdominal pain.  All other systems reviewed and are negative.   Dilation: 7 Effacement (%): 100 Station: 0 Exam by:: DCALLAWAY, RN Blood pressure 135/79, pulse 72, temperature 97.9 F (36.6 C), temperature source Oral, resp. rate 18, height 5\' 5"  (1.651 m), weight 59.875 kg (132 lb), last menstrual period 03/14/2013. Maternal Exam:  Uterine Assessment: Contraction strength is firm.  Contraction frequency is regular.   Abdomen: Estimated fetal weight is 6.5-7lbs.    Introitus: Vagina is positive for vaginal discharge (mucusy).    Fetal Exam Fetal Monitor Review: Baseline rate: 130's.  Variability: moderate (6-25 bpm).   Pattern: accelerations present.    Fetal State Assessment: Category I - tracings are  normal.     Physical Exam  Constitutional: She is oriented to person, place, and time. She appears well-developed and well-nourished. No distress.  Appears uncomfortable  HENT:  Head: Normocephalic.  Neck: Normal range of motion. Neck supple.  Cardiovascular: Normal rate, regular rhythm and normal heart sounds.   Respiratory: Effort normal and breath sounds normal.  GI: Soft. There is no tenderness.  Genitourinary: No bleeding around the vagina. Vaginal discharge (mucusy) found.  Neurological: She is alert and oriented to person, place, and time.  Skin: Skin is warm and dry.    Prenatal labs: ABO, Rh: AB/POS/-- (01/21 1552) Antibody: NEG (01/21 1552) Rubella: 4.43 (01/21 1552) RPR: NON REAC (03/31 1352)  HBsAg: NEGATIVE (01/21 1552)  HIV: NON REACTIVE (03/31 1352)  GBS: Negative (05/27 0000)   Assessment/Plan: 28 yo G1P0000 at 4176w3d wks IUP Active Labor Category I FHR Tracing GBS neg Hx of Atrial Fibrillation   Plan: Admit to [facility] Suit[facility] Epidural per patient request Anticipate NSVD  [facility],Amaryllis Malmquist 12/22/2013, 5:27 AM

## 2013-12-22 NOTE — Progress Notes (Signed)
Sherri Buchanan is a 28 y.o. G1P0000 at 4053w3d admitted for active labor  Subjective: Doing well. Feeling comfortable after epidural.  +FM.   Objective: BP 131/79  Pulse 71  Temp(Src) 97.9 F (36.6 C) (Oral)  Resp 18  Ht 5\' 5"  (1.651 m)  Wt 59.875 kg (132 lb)  BMI 21.97 kg/m2  SpO2 95%  LMP 03/14/2013      FHT:  FHR: 140 bpm, variability: moderate,  accelerations:  Present,  decelerations:  Absent UC:   regular, every 1-3 minutes SVE:   Dilation: 9 Effacement (%): 100 Station: +1 Exam by:: dr. Reola Calkinsbeck  Labs: Lab Results  Component Value Date   WBC 10.7* 12/22/2013   HGB 11.1* 12/22/2013   HCT 33.3* 12/22/2013   MCV 76.7* 12/22/2013   PLT 217 12/22/2013    Assessment / Plan: Spontaneous labor, progressing normally  Labor: Progressing normally Fetal Wellbeing:  Category I Pain Control:  Epidural I/D:  gbs neg Anticipated MOD:  NSVD  Wyndell Cardiff L 12/22/2013, 9:26 AM

## 2013-12-22 NOTE — Lactation Note (Signed)
This note was copied from the chart of Sherri Buchanan. Lactation Consultation Note  Patient Name: Sherri Buchanan Today's Date: 12/22/2013 Reason for consult: Initial assessment  Baby 3 hours of life. [facility] due to mom wanting a bottle. Discussed with mom the benefits of offering a exclusive breastmilk and the risks of formula. Mom states that she wants to nurse, just concerned that baby get enough to eat. Discussed with mom that colostrum is concentrated. Mom able to hand express colostrum from left breast. She was able to return demonstrate effect hand expression. Assisted mom to latch baby in football hold to left breast. Baby suckled for a few seconds and stopped. LC assessed baby's mouth with gloved finger. Baby has a very gentle suck. Enc baby to suck finger, baby's tongue moves effectively across lower gumline. Mom latched baby, baby wants to lick and suck at the breast. Baby latched and suckled rhythmically for several minutes, then stopped and rests. Enc mom to offer lots of STS, nurse with cues and at least 8-12 times per 24 hours. Enc mom to call out for assistance with latch as needed. Mom given [facility] brochure, aware of OP/BFSG and community resources. Maternal Data Infant to breast within first hour of birth: Yes Has patient been taught Hand Expression?: Yes Does the patient have breastfeeding experience prior to this delivery?: No  Feeding Feeding Type: Breast Fed  LATCH Score/Interventions Latch: Repeated attempts needed to sustain latch, nipple held in mouth throughout feeding, stimulation needed to elicit sucking reflex. Intervention(s): Skin to skin;Teach feeding cues Intervention(s): Adjust position;Assist with latch;Breast compression  Audible Swallowing: None  Type of Nipple: Flat (Semi flat) Intervention(s): No intervention needed  Comfort (Breast/Nipple): Soft / non-tender     Hold (Positioning): No assistance needed to correctly  position infant at breast. Intervention(s): Support Pillows;Breastfeeding basics reviewed  LATCH Score: 6  Lactation Tools Discussed/Used     Consult Status Consult Status: Follow-up Date: 12/23/13 Follow-up type: In-patient    Sherri Buchanan, Sherri Buchanan 12/22/2013, 5:32 PM

## 2013-12-22 NOTE — Anesthesia Preprocedure Evaluation (Signed)
Anesthesia Evaluation  Patient identified by MRN, date of birth, ID band Patient awake    Reviewed: Allergy & Precautions, H&P , Patient's Chart, lab work & pertinent test results  Airway Mallampati: II TM Distance: >3 FB Neck ROM: full    Dental  (+) Teeth Intact   Pulmonary  breath sounds clear to auscultation        Cardiovascular - dysrhythmias (not currently in afib) Atrial Fibrillation Rhythm:regular Rate:Normal     Neuro/Psych    GI/Hepatic   Endo/Other    Renal/GU      Musculoskeletal   Abdominal   Peds  Hematology   Anesthesia Other Findings       Reproductive/Obstetrics (+) Pregnancy                           Anesthesia Physical Anesthesia Plan  ASA: II  Anesthesia Plan: Epidural   Post-op Pain Management:    Induction:   Airway Management Planned:   Additional Equipment:   Intra-op Plan:   Post-operative Plan:   Informed Consent: I have reviewed the patients History and Physical, chart, labs and discussed the procedure including the risks, benefits and alternatives for the proposed anesthesia with the patient or authorized representative who has indicated his/her understanding and acceptance.   Dental Advisory Given  Plan Discussed with:   Anesthesia Plan Comments: (Labs checked- platelets confirmed with RN in room. Fetal heart tracing, per RN, reported to be stable enough for sitting procedure. Discussed epidural, and patient consents to the procedure:  included risk of possible headache,backache, failed block, allergic reaction, and nerve injury. This patient was asked if she had any questions or concerns before the procedure started.)        Anesthesia Quick Evaluation

## 2013-12-22 NOTE — MAU Note (Signed)
PT SAYS SHE STARTED HURTING AT 0245,   DENIES HSV AND MRSA.  GBS-  NEG.  INDUCTION  SCH  FOR 7-6.  HAS AN APPOINTMENT  TODAY

## 2013-12-22 NOTE — Anesthesia Procedure Notes (Signed)
Epidural Patient location during procedure: OB  Preanesthetic Checklist Completed: patient identified, site marked, surgical consent, pre-op evaluation, timeout performed, IV checked, risks and benefits discussed and monitors and equipment checked  Epidural Patient position: sitting Prep: site prepped and draped and DuraPrep Patient monitoring: continuous pulse ox and blood pressure Approach: midline Injection technique: LOR air  Needle:  Needle type: Tuohy  Needle gauge: 17 G Needle length: 9 cm and 9 Needle insertion depth: 4 cm Catheter type: closed end flexible Catheter size: 19 Gauge Catheter at skin depth: 10 cm Test dose: negative  Assessment Events: blood not aspirated, injection not painful, no injection resistance, negative IV test and no paresthesia  Additional Notes Dosing of Epidural:  1st dose, through catheter .............................................  Xylocaine 40 mg  2nd dose, through catheter, after waiting 3 minutes.........Xylocaine 60 mg    ( 1% Xylo charted as a single dose in Epic Meds for ease of charting; actual dosing was fractionated as above, for saftey's sake)  As each dose occurred, patient was free of IV sx; and patient exhibited no evidence of SA injection.  Patient is more comfortable after epidural dosed. Please see RN's note for documentation of vital signs,and FHR which are stable.  Patient reminded not to try to ambulate with numb legs, and that an RN must be present when she attempts to get up.       

## 2013-12-22 NOTE — H&P (Signed)
Attestation of Attending Supervision of Advanced Practitioner (PA/CNM/NP): Evaluation and management procedures were performed by the Advanced Practitioner under my supervision and collaboration.  I have reviewed the Advanced Practitioner's note and chart, and I agree with the management and plan.  Reva BoresPRATT,Wymon Swaney S, MD [facility] Attending 12/22/2013 6:56 AM

## 2013-12-23 NOTE — Anesthesia Postprocedure Evaluation (Signed)
  Anesthesia Post-op Note  Patient: Sherri Buchanan  Procedure(s) Performed: * No procedures listed *  Patient Location: PACU and Mother/Baby  Anesthesia Type:Epidural  Level of Consciousness: awake, alert  and oriented  Airway and Oxygen Therapy: Patient Spontanous Breathing  Post-op Pain: mild  Post-op Assessment: Patient's Cardiovascular Status Stable, Respiratory Function Stable, No signs of Nausea or vomiting, Adequate PO intake, Pain level controlled, No headache, No backache, No residual numbness and No residual motor weakness  Post-op Vital Signs: Reviewed and stable  Last Vitals:  Filed Vitals:   12/23/13 0702  BP: 102/65  Pulse: 85  Temp: 36.5 C  Resp: 18    Complications: No apparent anesthesia complications

## 2013-12-23 NOTE — Lactation Note (Signed)
This note was copied from the chart of Sherri Buchanan. Lactation Consultation Note Follow up visit at 31 hours of age.  Mom reports she is not sure baby is eating well with breast feeding.  Baby has has 7 feedings, 5 stools and one void at delivery.  Mom has been using a nipple shield, but latch scores were not charted with use so unable to assess milk transfer.  Baby has very tight lips and doesn't open her mouth well so mom reports feeding her when she gets fussy and opens her mouth to cry.  Baby is asleep swaddled with clothes on.  Attempted assessment with gloved finger.  Difficult to get baby to open mouth and tongue goes to roof of mouth instead of extending past gumline.  Baby sucks a little, but disorganized with tongue thrust.  Unable to assess well for tight frenulum, but assuming baby may have some tongue mobility limitations.  Baby does not latch well with nipple shield, but several sucks with stimulation, no colostrum in nipple shield.  Discussed feeding options with mom and opted for gerber formula in a bottle with slow flow nipple.  [facility].  Assisted with feeding of bottle and baby did not do well, she thrusted tongue and gagged only taking about 7 of 12 mls offered.  Encouraged mom to pump and continue breast attempts and to supplement as needed.  Discussed feeding frequency.  Mom to call for assist as needed.   Patient Name: Sherri TurkeyVictoria Mirarchi JYNWG'NToday's Date: 12/23/2013 Reason for consult: Follow-up assessment   Maternal Data    Feeding Feeding Type: Breast Fed Length of feed: 0 min  LATCH Score/Interventions Latch: Repeated attempts needed to sustain latch, nipple held in mouth throughout feeding, stimulation needed to elicit sucking reflex. Intervention(s): Waking techniques;Teach feeding cues  Audible Swallowing: None  Type of Nipple: Everted at rest and after stimulation Intervention(s): Hand pump;Double electric pump  Comfort  (Breast/Nipple): Soft / non-tender     Hold (Positioning): Assistance needed to correctly position infant at breast and maintain latch. Intervention(s): Skin to skin;Position options;Support Pillows;Breastfeeding basics reviewed  LATCH Score: 6  Lactation Tools Discussed/Used Tools: Nipple Dorris CarnesShields Date initiated:: 12/23/13   Consult Status Consult Status: Follow-up Date: 12/24/13 Follow-up type: In-patient    [name], [name] 12/23/2013, 11:38 PM

## 2013-12-23 NOTE — Progress Notes (Signed)
Post Partum Day 1 Subjective: no complaints, up ad lib, voiding, tolerating PO and + flatus  Objective: Blood pressure 102/65, pulse 85, temperature 97.7 F (36.5 C), temperature source Axillary, resp. rate 18, height 5\' 5"  (1.651 m), weight 59.875 kg (132 lb), last menstrual period 03/14/2013, SpO2 95.00%, unknown if currently breastfeeding.  Physical Exam:  General: alert, cooperative, appears stated age and no distress Lochia: appropriate Uterine Fundus: firm Incision: NA DVT Evaluation: No evidence of DVT seen on physical exam. Negative Homan's sign. No cords or calf tenderness. No significant calf/ankle edema.   Recent Labs  12/22/13 0510  HGB 11.1*  HCT 33.3*    Assessment/Plan: Plan for discharge tomorrow, Breastfeeding, Lactation consult and Contraception undecided - readdress tomorrwo   LOS: 1 day   Aurther Harlin RYAN 12/23/2013, 9:21 AM

## 2013-12-24 MED ORDER — IBUPROFEN 600 MG PO TABS: 600.0000 mg | ORAL_TABLET | Freq: Four times a day (QID) | ORAL | Status: AC

## 2013-12-24 NOTE — Discharge Summary (Signed)
Obstetric Discharge Summary Reason for Admission: onset of labor Prenatal Procedures: NST Intrapartum Procedures: spontaneous vaginal delivery Postpartum Procedures: none Complications-Operative and Postpartum: 1st degree perineal laceration Hemoglobin  Date Value Ref Range Status  12/22/2013 11.1* 12.0 - 15.0 g/dL Final     HCT  Date Value Ref Range Status  12/22/2013 33.3* 36.0 - 46.0 % Final   Hospital Course:  Pt. Is a 28 y/o F G1P0 presenting for contractions and onset of labor without ROM. PNC in the Low Risk clinic. She was admitted to L&D, and subsequently progressed to NSVD. Delivery was complicated by a 1st degree perineal tear that was repaired by Dr. Reola CalkinsBeck. She had an uncomplicated postpartum course, and is now stable and ready for discharge.     Delivery Note  At 2:05 PM a viable female was delivered via Vaginal, Spontaneous Delivery (Presentation: ; Occiput Anterior). APGAR: 8, 9; weight TBD.  Placenta status: Intact, Spontaneous. Cord: 3 vessels with the following complications: None.  Anesthesia: Epidural  Episiotomy: None  Lacerations: 1st degree;Perineal  Suture Repair: 3.0 monocryl  Est. Blood Loss (mL): 350  Mom to postpartum. Baby to Couplet care / Skin to Skin.  Pt pushed with good maternal effort to deliver a liveborn female via NSVD with spontaneous cry. Baby placed on maternal abdomen. Delayed cord clamping performed. Cord cut by FOB. Placenta delivered intact with 3V cord via traction and pitocin. 1st degree tear repaired with single figure of 8 and a single subcuticular for cosmesis. No complications. Mom and baby to postpartum. Mom spiked a temperature immediately at delivery but will monitor off abx for now.  BECK, KELI L  12/22/2013, 2:56 PM   Physical Exam:  General: alert, cooperative and no distress Lochia: appropriate Uterine Fundus: firm Incision: N/A DVT Evaluation: No evidence of DVT seen on physical exam. No cords or calf tenderness.  Discharge  Diagnoses: Term Pregnancy-delivered  Discharge Information: Date: 12/24/2013 Activity: unrestricted and pelvic rest Diet: routine Medications: PNV, Motrin Condition: stable Instructions: refer to practice specific booklet Discharge to: home Follow-up Information   [facility] ([facility])    Specialty:  Obstetrics and Gynecology   Contact information:   [address] [place] [id number] [phone]      Newborn Data: Live born female  Birth Weight: 6 lb 14.9 oz (3145 g) APGAR: 8, 9  Home with mother.  Melancon, Caleb G 12/24/2013, 9:06 AM   I spoke with and examined patient and agree with resident/PA/SNM's note and plan of care.  Breast/bottlefeeding, mirena for contraception.  Cheral MarkerKimberly R. Verlin Uher, CNM, [facility]NP-BC 12/24/2013 11:48 AM

## 2013-12-24 NOTE — Lactation Note (Signed)
This note was copied from the chart of Sherri TurkeyVictoria Amparo. Lactation Consultation Note Mom using DEBP to stimulate nipples to pull out. Using #20 NS, fitted for #16 NS, stated comfortable. Comfort gels. Mom filling, no colostrum noted w/DEBP for 5 min. Pump. Encouraged to pump for 10-15 min. W/curve tip syring formula inserted in NS to stimulate baby to eat. Baby sleepy. Noted good feeding. Hand expression noted colostrum. Explained to mom colostrum is thick, so hand expression you can get mom than pumping. Encouraged football hold unswaddled and stimulate to feed. Basic BF information reviewed. Encouraged to have f/u appt. Since using NS. Patient Name: Sherri Buchanan Today's Date: 12/24/2013 Reason for consult: Difficult latch   Maternal Data    Feeding Feeding Type: Breast Fed Length of feed: 25 min  LATCH Score/Interventions Latch: Repeated attempts needed to sustain latch, nipple held in mouth throughout feeding, stimulation needed to elicit sucking reflex. Intervention(s): Skin to skin;Teach feeding cues;Waking techniques Intervention(s): Adjust position;Assist with latch;Breast massage;Breast compression  Audible Swallowing: A few with stimulation Intervention(s): Skin to skin;Hand expression Intervention(s): Skin to skin;Hand expression;Alternate breast massage  Type of Nipple: Everted at rest and after stimulation (short shaft, fairly compressible) Intervention(s): Double electric pump (NS #16, #20)  Comfort (Breast/Nipple): Filling, red/small blisters or bruises, mild/mod discomfort  Problem noted: Filling Interventions (Filling): Massage;Firm support;Double electric pump  Hold (Positioning): Assistance needed to correctly position infant at breast and maintain latch. Intervention(s): Breastfeeding basics reviewed;Support Pillows;Position options;Skin to skin  LATCH Score: 6  Lactation Tools Discussed/Used Tools: Nipple Shields;Pump;Comfort gels Nipple shield size:  20;16 Breast pump type: Double-Electric Breast Pump   Consult Status Consult Status: Complete Date: 12/24/13    Charyl DancerCARVER, Robben Jagiello G 12/24/2013, 10:52 AM

## 2013-12-24 NOTE — Discharge Instructions (Signed)
Postpartum Care After Vaginal Delivery  °After you deliver your newborn (postpartum period), the usual stay in the hospital is 24-72 hours. If there were problems with your labor or delivery, or if you have other medical problems, you might be in the hospital longer.  °While you are in the hospital, you will receive help and instructions on how to care for yourself and your newborn during the postpartum period.  °While you are in the hospital:  °Be sure to tell your nurses if you have pain or discomfort, as well as where you feel the pain and what makes the pain worse.  °If you had an incision made near your vagina (episiotomy) or if you had some tearing during delivery, the nurses may put ice packs on your episiotomy or tear. The ice packs may help to reduce the pain and swelling.  °If you are breastfeeding, you may feel uncomfortable contractions of your uterus for a couple of weeks. This is normal. The contractions help your uterus get back to normal size.  °It is normal to have some bleeding after delivery.  °For the first 1-3 days after delivery, the flow is red and the amount may be similar to a period.  °It is common for the flow to start and stop.  °In the first few days, you may pass some small clots. Let your nurses know if you begin to pass large clots or your flow increases.  °Do not flush blood clots down the toilet before having the nurse look at them.  °During the next 3-10 days after delivery, your flow should become more watery and pink or brown-tinged in color.  °Ten to fourteen days after delivery, your flow should be a small amount of yellowish-white discharge.  °The amount of your flow will decrease over the first few weeks after delivery. Your flow may stop in 6-8 weeks. Most women have had their flow stop by 12 weeks after delivery. °You should change your sanitary pads frequently.  °Wash your hands thoroughly with soap and water for at least 20 seconds after changing pads, using the toilet,  or before holding or feeding your newborn.  °You should feel like you need to empty your bladder within the first 6-8 hours after delivery.  °In case you become weak, lightheaded, or faint, call your nurse before you get out of bed for the first time and before you take a shower for the first time.  °Within the first few days after delivery, your breasts may begin to feel tender and full. This is called engorgement. Breast tenderness usually goes away within 48-72 hours after engorgement occurs. You may also notice milk leaking from your breasts. If you are not breastfeeding, do not stimulate your breasts. Breast stimulation can make your breasts produce more milk.  °Spending as much time as possible with your newborn is very important. During this time, you and your newborn can feel close and get to know each other. Having your newborn stay in your room (rooming in) will help to strengthen the bond with your newborn. It will give you time to get to know your newborn and become comfortable caring for your newborn.  °Your hormones change after delivery. Sometimes the hormone changes can temporarily cause you to feel sad or tearful. These feelings should not last more than a few days. If these feelings last longer than that, you should talk to your caregiver.  °If desired, talk to your caregiver about methods of family planning or contraception.  °  Talk to your caregiver about immunizations. Your caregiver may want you to have the following immunizations before leaving the hospital:  °Tetanus, diphtheria, and pertussis (Tdap) or tetanus and diphtheria (Td) immunization. It is very important that you and your family (including grandparents) or others caring for your newborn are up-to-date with the Tdap or Td immunizations. The Tdap or Td immunization can help protect your newborn from getting ill.  °Rubella immunization.  °Varicella (chickenpox) immunization.  °Influenza immunization. You should receive this annual  immunization if you did not receive the immunization during your pregnancy. °Document Released: 04/06/2007 Document Revised: 03/03/2012 Document Reviewed: 02/04/2012  °[employer]® Patient Information ©2015 [employer]. This information is not intended to replace advice given to you by your health care provider. Make sure you discuss any questions you have with your health care provider.  ° °Breastfeeding °Deciding to breastfeed is one of the best choices you can make for you and your baby. A change in hormones during pregnancy causes your breast tissue to grow and increases the number and size of your milk ducts. These hormones also allow proteins, sugars, and fats from your blood supply to make breast milk in your milk-producing glands. Hormones prevent breast milk from being released before your baby is born as well as prompt milk flow after birth. Once breastfeeding has begun, thoughts of your baby, as well as his or her sucking or crying, can stimulate the release of milk from your milk-producing glands.  °BENEFITS OF BREASTFEEDING °For Your Baby °· Your first milk (colostrum) helps your baby's digestive system function better.   °· There are antibodies in your milk that help your baby fight off infections.   °· Your baby has a lower incidence of asthma, allergies, and sudden infant death syndrome.   °· The nutrients in breast milk are better for your baby than infant formulas and are designed uniquely for your baby's needs.   °· Breast milk improves your baby's brain development.   °· Your baby is less likely to develop other conditions, such as childhood obesity, asthma, or type 2 diabetes mellitus.   °For You  °· Breastfeeding helps to create a very special bond between you and your baby.   °· Breastfeeding is convenient. Breast milk is always available at the correct temperature and costs nothing.   °· Breastfeeding helps to burn calories and helps you lose the weight gained during pregnancy.    °· Breastfeeding makes your uterus contract to its prepregnancy size faster and slows bleeding (lochia) after you give birth.   °· Breastfeeding helps to lower your risk of developing type 2 diabetes mellitus, osteoporosis, and breast or ovarian cancer later in life. °SIGNS THAT YOUR BABY IS HUNGRY °Early Signs of Hunger  °· Increased alertness or activity. °· Stretching. °· Movement of the head from side to side. °· Movement of the head and opening of the mouth when the corner of the mouth or cheek is stroked (rooting). °· Increased sucking sounds, smacking lips, cooing, sighing, or squeaking. °· Hand-to-mouth movements. °· Increased sucking of fingers or hands. °Late Signs of Hunger °· Fussing. °· Intermittent crying. °Extreme Signs of Hunger °Signs of extreme hunger will require calming and consoling before your baby will be able to breastfeed successfully. Do not wait for the following signs of extreme hunger to occur before you initiate breastfeeding:   °· Restlessness. °· A loud, strong cry. °·  Screaming. °BREASTFEEDING BASICS °Breastfeeding Initiation °· Find a comfortable place to sit or lie down, with your neck and back well supported. °· Place a pillow or   rolled up blanket under your baby to bring him or her to the level of your breast (if you are seated). Nursing pillows are specially designed to help support your arms and your baby while you breastfeed. °· Make sure that your baby's abdomen is facing your abdomen.   °· Gently massage your breast. With your fingertips, massage from your chest wall toward your nipple in a circular motion. This encourages milk flow. You may need to continue this action during the feeding if your milk flows slowly. °· Support your breast with 4 fingers underneath and your thumb above your nipple. Make sure your fingers are well away from your nipple and your baby's mouth.   °· Stroke your baby's lips gently with your finger or nipple.   °· When your baby's mouth is open  wide enough, quickly bring your baby to your breast, placing your entire nipple and as much of the colored area around your nipple (areola) as possible into your baby's mouth.   °¨ More areola should be visible above your baby's upper lip than below the lower lip.   °¨ Your baby's tongue should be between his or her lower gum and your breast.   °· Ensure that your baby's mouth is correctly positioned around your nipple (latched). Your baby's lips should create a seal on your breast and be turned out (everted). °· It is common for your baby to suck about 2-3 minutes in order to start the flow of breast milk. °Latching °Teaching your baby how to latch on to your breast properly is very important. An improper latch can cause nipple pain and decreased milk supply for you and poor weight gain in your baby. Also, if your baby is not latched onto your nipple properly, he or she may swallow some air during feeding. This can make your baby fussy. Burping your baby when you switch breasts during the feeding can help to get rid of the air. However, teaching your baby to latch on properly is still the best way to prevent fussiness from swallowing air while breastfeeding. °Signs that your baby has successfully latched on to your nipple:    °· Silent tugging or silent sucking, without causing you pain.   °· Swallowing heard between every 3-4 sucks.   °·  Muscle movement above and in front of his or her ears while sucking.   °Signs that your baby has not successfully latched on to nipple:  °· Sucking sounds or smacking sounds from your baby while breastfeeding. °· Nipple pain. °If you think your baby has not latched on correctly, slip your finger into the corner of your baby's mouth to break the suction and place it between your baby's gums. Attempt breastfeeding initiation again. °Signs of Successful Breastfeeding °Signs from your baby:   °· A gradual decrease in the number of sucks or complete cessation of sucking.   °· Falling  asleep.   °· Relaxation of his or her body.   °· Retention of a small amount of milk in his or her mouth.   °· Letting go of your breast by himself or herself. °Signs from you: °· Breasts that have increased in firmness, weight, and size 1-3 hours after feeding.   °· Breasts that are softer immediately after breastfeeding. °· Increased milk volume, as well as a change in milk consistency and color by the fifth day of breastfeeding.   °· Nipples that are not sore, cracked, or bleeding. °Signs That Your Baby is Getting Enough Milk °· Wetting at least 3 diapers in a 24-hour period. The urine should be clear and   pale yellow by age 5 days. °· At least 3 stools in a 24-hour period by age 5 days. The stool should be soft and yellow. °· At least 3 stools in a 24-hour period by age 7 days. The stool should be seedy and yellow. °· No loss of weight greater than 10% of birth weight during the first 3 days of age. °· Average weight gain of 4-7 ounces (113-198 g) per week after age 4 days. °· Consistent daily weight gain by age 5 days, without weight loss after the age of 2 weeks. °After a feeding, your baby may spit up a small amount. This is common. °BREASTFEEDING FREQUENCY AND DURATION °Frequent feeding will help you make more milk and can prevent sore nipples and breast engorgement. Breastfeed when you feel the need to reduce the fullness of your breasts or when your baby shows signs of hunger. This is called "breastfeeding on demand." Avoid introducing a pacifier to your baby while you are working to establish breastfeeding (the first 4-6 weeks after your baby is born). After this time you may choose to use a pacifier. Research has shown that pacifier use during the first year of a baby's life decreases the risk of sudden infant death syndrome (SIDS). °Allow your baby to feed on each breast as long as he or she wants. Breastfeed until your baby is finished feeding. When your baby unlatches or falls asleep while feeding from  the first breast, offer the second breast. Because newborns are often sleepy in the first few weeks of life, you may need to awaken your baby to get him or her to feed. °Breastfeeding times will vary from baby to baby. However, the following rules can serve as a guide to help you ensure that your baby is properly fed: °· Newborns (babies 4 weeks of age or younger) may breastfeed every 1-3 hours. °· Newborns should not go longer than 3 hours during the day or 5 hours during the night without breastfeeding. °· You should breastfeed your baby a minimum of 8 times in a 24-hour period until you begin to introduce solid foods to your baby at around 6 months of age. °BREAST MILK PUMPING °Pumping and storing breast milk allows you to ensure that your baby is exclusively fed your breast milk, even at times when you are unable to breastfeed. This is especially important if you are going back to work while you are still breastfeeding or when you are not able to be present during feedings. Your lactation consultant can give you guidelines on how long it is safe to store breast milk.  °A breast pump is a machine that allows you to pump milk from your breast into a sterile bottle. The pumped breast milk can then be stored in a refrigerator or freezer. Some breast pumps are operated by hand, while others use electricity. Ask your lactation consultant which type will work best for you. Breast pumps can be purchased, but some hospitals and breastfeeding support groups lease breast pumps on a monthly basis. A lactation consultant can teach you how to hand express breast milk, if you prefer not to use a pump.  °CARING FOR YOUR BREASTS WHILE YOU BREASTFEED °Nipples can become dry, cracked, and sore while breastfeeding. The following recommendations can help keep your breasts moisturized and healthy: °· Avoid using soap on your nipples.   °· Wear a supportive bra. Although not required, special nursing bras and tank tops are designed to  allow access to your breasts for   breastfeeding without taking off your entire bra or top. Avoid wearing underwire-style bras or extremely tight bras. °· Air dry your nipples for 3-4 minutes after each feeding.   °· Use only cotton bra pads to absorb leaked breast milk. Leaking of breast milk between feedings is normal.   °· Use lanolin on your nipples after breastfeeding. Lanolin helps to maintain your skin's normal moisture barrier. If you use pure lanolin, you do not need to wash it off before feeding your baby again. Pure lanolin is not toxic to your baby. You may also hand express a few drops of breast milk and gently massage that milk into your nipples and allow the milk to air dry. °In the first few weeks after giving birth, some women experience extremely full breasts (engorgement). Engorgement can make your breasts feel heavy, warm, and tender to the touch. Engorgement peaks within 3-5 days after you give birth. The following recommendations can help ease engorgement: °· Completely empty your breasts while breastfeeding or pumping. You may want to start by applying warm, moist heat (in the shower or with warm water-soaked hand towels) just before feeding or pumping. This increases circulation and helps the milk flow. If your baby does not completely empty your breasts while breastfeeding, pump any extra milk after he or she is finished. °· Wear a snug bra (nursing or regular) or tank top for 1-2 days to signal your body to slightly decrease milk production. °· Apply ice packs to your breasts, unless this is too uncomfortable for you. °· Make sure that your baby is latched on and positioned properly while breastfeeding. °If engorgement persists after 48 hours of following these recommendations, contact your health care provider or a lactation consultant. °OVERALL HEALTH CARE RECOMMENDATIONS WHILE BREASTFEEDING °· Eat healthy foods. Alternate between meals and snacks, eating 3 of each per day. Because what you  eat affects your breast milk, some of the foods may make your baby more irritable than usual. Avoid eating these foods if you are sure that they are negatively affecting your baby. °· Drink milk, fruit juice, and water to satisfy your thirst (about 10 glasses a day).   °· Rest often, relax, and continue to take your prenatal vitamins to prevent fatigue, stress, and anemia. °· Continue breast self-awareness checks. °· Avoid chewing and smoking tobacco. °· Avoid alcohol and drug use. °Some medicines that may be harmful to your baby can pass through breast milk. It is important to ask your health care provider before taking any medicine, including all over-the-counter and prescription medicine as well as vitamin and herbal supplements. °It is possible to become pregnant while breastfeeding. If birth control is desired, ask your health care provider about options that will be safe for your baby. °SEEK MEDICAL CARE IF:  °· You feel like you want to stop breastfeeding or have become frustrated with breastfeeding. °· You have painful breasts or nipples. °· Your nipples are cracked or bleeding. °· Your breasts are red, tender, or warm. °· You have a swollen area on either breast. °· You have a fever or chills. °· You have nausea or vomiting. °· You have drainage other than breast milk from your nipples. °· Your breasts do not become full before feedings by the fifth day after you give birth. °· You feel sad and depressed. °· Your baby is too sleepy to eat well. °· Your baby is having trouble sleeping.   °· Your baby is wetting less than 3 diapers in a 24-hour period. °· Your baby has   less than 3 stools in a 24-hour period. °· Your baby's skin or the white part of his or her eyes becomes yellow.   °· Your baby is not gaining weight by 5 days of age. °SEEK IMMEDIATE MEDICAL CARE IF:  °· Your baby is overly tired (lethargic) and does not want to wake up and feed. °· Your baby develops an unexplained fever. °Document Released:  06/09/2005 Document Revised: 06/14/2013 Document Reviewed: 12/01/2012 °[employer]® Patient Information ©2015 [employer]. This information is not intended to replace advice given to you by your health care provider. Make sure you discuss any questions you have with your health care provider. ° °

## 2013-12-26 ENCOUNTER — Inpatient Hospital Stay (HOSPITAL_COMMUNITY): Admission: RE | Admit: 2013-12-26 | Payer: BC Managed Care – PPO | Source: Ambulatory Visit

## 2014-01-19 ENCOUNTER — Encounter: Payer: Self-pay | Admitting: *Deleted

## 2014-02-06 ENCOUNTER — Encounter: Payer: Self-pay | Admitting: Obstetrics and Gynecology

## 2014-02-06 ENCOUNTER — Ambulatory Visit (INDEPENDENT_AMBULATORY_CARE_PROVIDER_SITE_OTHER): Payer: BC Managed Care – PPO | Admitting: Obstetrics & Gynecology

## 2014-02-06 ENCOUNTER — Telehealth: Payer: Self-pay | Admitting: *Deleted

## 2014-02-06 ENCOUNTER — Encounter: Payer: Self-pay | Admitting: Obstetrics & Gynecology

## 2014-02-06 VITALS — BP 109/76 | HR 67 | Temp 98.5°F | Ht 65.0 in | Wt 105.2 lb

## 2014-02-06 DIAGNOSIS — Z30430 Encounter for insertion of intrauterine contraceptive device: Secondary | ICD-10-CM

## 2014-02-06 DIAGNOSIS — Z01419 Encounter for gynecological examination (general) (routine) without abnormal findings: Secondary | ICD-10-CM

## 2014-02-06 LAB — POCT PREGNANCY, URINE: Preg Test, Ur: NEGATIVE

## 2014-02-06 MED ORDER — LEVONORGESTREL 20 MCG/24HR IU IUD
INTRAUTERINE_SYSTEM | Freq: Once | INTRAUTERINE | Status: AC
  Administered 2014-02-06: 1 via INTRAUTERINE

## 2014-02-06 NOTE — Telephone Encounter (Signed)
Attempted to contact patient, no answer, left message that we are asking her to move up her scheduled appointment and to please call the clinic.

## 2014-02-06 NOTE — Patient Instructions (Signed)
Intrauterine Device Insertion, Care After Refer to this sheet in the next few weeks. These instructions provide you with information on caring for yourself after your procedure. Your health care provider may also give you more specific instructions. Your treatment has been planned according to current medical practices, but problems sometimes occur. Call your health care provider if you have any problems or questions after your procedure. WHAT TO EXPECT AFTER THE PROCEDURE Insertion of the IUD may cause some discomfort, such as cramping. The cramping should improve after the IUD is in place. You may have bleeding after the procedure. This is normal. It varies from light spotting for a few days to menstrual-like bleeding. When the IUD is in place, a string will extend past the cervix into the vagina for 1-2 inches. The strings should not bother you or your partner. If they do, talk to your health care provider.  HOME CARE INSTRUCTIONS   Check your intrauterine device (IUD) to make sure it is in place before you resume sexual activity. You should be able to feel the strings. If you cannot feel the strings, something may be wrong. The IUD may have fallen out of the uterus, or the uterus may have been punctured (perforated) during placement. Also, if the strings are getting longer, it may mean that the IUD is being forced out of the uterus. You no longer have full protection from pregnancy if any of these problems occur.  You may resume sexual intercourse if you are not having problems with the IUD. The copper IUD is considered immediately effective, and the hormone IUD works right away if inserted within 7 days of your period starting. You will need to use a backup method of birth control for 7 days if the IUD in inserted at any other time in your cycle.  Continue to check that the IUD is still in place by feeling for the strings after every menstrual period.  You may need to take pain medicine such as  acetaminophen or ibuprofen. Only take medicines as directed by your health care provider. SEEK MEDICAL CARE IF:   You have bleeding that is heavier or lasts longer than a normal menstrual cycle.  You have a fever.  You have increasing cramps or abdominal pain not relieved with medicine.  You have abdominal pain that does not seem to be related to the same area of earlier cramping and pain.  You are lightheaded, unusually weak, or faint.  You have abnormal vaginal discharge or smells.  You have pain during sexual intercourse.  You cannot feel the IUD strings, or the IUD string has gotten longer.  You feel the IUD at the opening of the cervix in the vagina.  You think you are pregnant, or you miss your menstrual period.  The IUD string is hurting your sex partner. MAKE SURE YOU:  Understand these instructions.  Will watch your condition.  Will get help right away if you are not doing well or get worse. Document Released: 02/05/2011 Document Revised: 03/30/2013 Document Reviewed: 11/28/2012 Exit[employer] Patient Information 2015 [employer]. This information is not intended to replace advice given to you by your health care provider. Make sure you discuss any questions you have with your health care provider. Levonorgestrel intrauterine device (IUD) What is this medicine? LEVONORGESTREL IUD (LEE voe nor jes trel) is a contraceptive (birth control) device. The device is placed inside the uterus by a healthcare professional. It is used to prevent pregnancy and can also be used to   treat heavy bleeding that occurs during your period. Depending on the device, it can be used for 3 to 5 years. This medicine may be used for other purposes; ask your health care provider or pharmacist if you have questions. COMMON BRAND NAME(S): LILETTA, Mirena, Skyla What should I tell my health care provider before I take this medicine? They need to know if you have any of these conditions: -abnormal Pap  smear -cancer of the breast, uterus, or cervix -diabetes -endometritis -genital or pelvic infection now or in the past -have more than one sexual partner or your partner has more than one partner -heart disease -history of an ectopic or tubal pregnancy -immune system problems -IUD in place -liver disease or tumor -problems with blood clots or take blood-thinners -use intravenous drugs -uterus of unusual shape -vaginal bleeding that has not been explained -an unusual or allergic reaction to levonorgestrel, other hormones, silicone, or polyethylene, medicines, foods, dyes, or preservatives -pregnant or trying to get pregnant -breast-feeding How should I use this medicine? This device is placed inside the uterus by a health care professional. Talk to your pediatrician regarding the use of this medicine in children. Special care may be needed. Overdosage: If you think you have taken too much of this medicine contact a poison control center or emergency room at once. NOTE: This medicine is only for you. Do not share this medicine with others. What if I miss a dose? This does not apply. What may interact with this medicine? Do not take this medicine with any of the following medications: -amprenavir -bosentan -fosamprenavir This medicine may also interact with the following medications: -aprepitant -barbiturate medicines for inducing sleep or treating seizures -bexarotene -griseofulvin -medicines to treat seizures like carbamazepine, ethotoin, felbamate, oxcarbazepine, phenytoin, topiramate -modafinil -pioglitazone -rifabutin -rifampin -rifapentine -some medicines to treat HIV infection like atazanavir, indinavir, lopinavir, nelfinavir, tipranavir, ritonavir -St. John's wort -warfarin This list may not describe all possible interactions. Give your health care provider a list of all the medicines, herbs, non-prescription drugs, or dietary supplements you use. Also tell them if  you smoke, drink alcohol, or use illegal drugs. Some items may interact with your medicine. What should I watch for while using this medicine? Visit your doctor or health care professional for regular check ups. See your doctor if you or your partner has sexual contact with others, becomes HIV positive, or gets a sexual transmitted disease. This product does not protect you against HIV infection (AIDS) or other sexually transmitted diseases. You can check the placement of the IUD yourself by reaching up to the top of your vagina with clean fingers to feel the threads. Do not pull on the threads. It is a good habit to check placement after each menstrual period. Call your doctor right away if you feel more of the IUD than just the threads or if you cannot feel the threads at all. The IUD may come out by itself. You may become pregnant if the device comes out. If you notice that the IUD has come out use a backup birth control method like condoms and call your health care provider. Using tampons will not change the position of the IUD and are okay to use during your period. What side effects may I notice from receiving this medicine? Side effects that you should report to your doctor or health care professional as soon as possible: -allergic reactions like skin rash, itching or hives, swelling of the face, lips, or tongue -fever, flu-like symptoms -genital sores -  high blood pressure -no menstrual period for 6 weeks during use -pain, swelling, warmth in the leg -pelvic pain or tenderness -severe or sudden headache -signs of pregnancy -stomach cramping -sudden shortness of breath -trouble with balance, talking, or walking -unusual vaginal bleeding, discharge -yellowing of the eyes or skin Side effects that usually do not require medical attention (report to your doctor or health care professional if they continue or are bothersome): -acne -breast pain -change in sex drive or  performance -changes in weight -cramping, dizziness, or faintness while the device is being inserted -headache -irregular menstrual bleeding within first 3 to 6 months of use -nausea This list may not describe all possible side effects. Call your doctor for medical advice about side effects. You may report side effects to FDA at 1-800-FDA-1088. Where should I keep my medicine? This does not apply. NOTE: This sheet is a summary. It may not cover all possible information. If you have questions about this medicine, talk to your doctor, pharmacist, or health care provider.  2015, [employer] Standard. (2011-07-10 13:54:04)  

## 2014-02-06 NOTE — Progress Notes (Signed)
Patient ID: Sherri Buchanan, female   DOB: 09/18/1985, 28 y.o.   MRN: [id number] Subjective:     Sherri Buchanan is a 28 y.o. female who presents for a postpartum visit. She is 6 weeks postpartum following a spontaneous vaginal delivery. I have fully reviewed the prenatal and intrapartum course. The delivery was at 40 3/7 gestational weeks. Outcome: spontaneous vaginal delivery. Anesthesia: epidural. Postpartum course has been uncomplicated. Baby's course has been unremarkable. Baby is feeding by bottle. Bleeding no bleeding. Bowel function is normal. Bladder function is normal. Patient is not sexually active. Contraception method is abstinence. Postpartum depression screening: negative.  The following portions of the patient's history were reviewed and updated as appropriate: allergies, current medications, past family history, past medical history, past social history, past surgical history and problem list.  Review of Systems A comprehensive review of systems was negative.   Objective:    BP 109/76  Pulse 67  Temp(Src) 98.5 F (36.9 C)  Ht 5\' 5"  (1.651 m)  Wt 105 lb 3.2 oz (47.718 kg)  BMI 17.51 kg/m2  Breastfeeding? No  Pt in NAD   IUD Insertion Procedure Note Patient identified, informed consent performed.  Discussed risks of irregular bleeding, cramping, infection, malpositioning or misplacement of the IUD outside the uterus which may require further procedures. Time out was performed.  Urine pregnancy test negative.  Speculum placed in the vagina.  Cervix visualized.  Cleaned with Betadine x 2.  Grasped anteriorly with a single tooth tenaculum.  Uterus sounded to 8 cm.  Mirena IUD placed per manufacturer's recommendations.  Strings trimmed to 3 cm. Tenaculum was removed, good hemostasis noted.  Patient tolerated procedure well.     Assessment:     6week postpartum exam. Pap smear not done at today's visit.   Plan:    1. Contraception: IUD 2. Patient was given post-procedure  instructions.  Patient was also asked to check IUD strings periodically and follow up in 4 weeks for IUD check.

## 2014-02-06 NOTE — Addendum Note (Signed)
Addended by: Kathee DeltonHILLMAN, CARRIE L on: 02/06/2014 02:52 PM   Modules accepted: Orders

## 2014-02-10 ENCOUNTER — Encounter: Payer: Self-pay | Admitting: General Practice

## 2014-03-08 ENCOUNTER — Ambulatory Visit: Payer: BC Managed Care – PPO | Admitting: Obstetrics & Gynecology

## 2014-03-08 ENCOUNTER — Telehealth: Payer: Self-pay | Admitting: General Practice

## 2014-03-08 NOTE — Telephone Encounter (Signed)
Patient has appt today at 29, Called patient to see if she can go ahead and come in for appt. Patient states she was just on hold to reschedule this appt. Told patient I will let the front office staff know and they will contact her with a new appt. Patient verbalized understanding and had no other questions

## 2014-03-23 ENCOUNTER — Telehealth: Payer: Self-pay | Admitting: *Deleted

## 2014-03-23 NOTE — Telephone Encounter (Signed)
Sherri Buchanan called and left a message that she is calling because she wants to get a note faxed to her insurance provider that states she does not smoke. States the doctor wrote a note a few months ago, but they said they need something from you. Requests a call back.

## 2014-03-27 ENCOUNTER — Ambulatory Visit (INDEPENDENT_AMBULATORY_CARE_PROVIDER_SITE_OTHER): Payer: BC Managed Care – PPO | Admitting: Obstetrics & Gynecology

## 2014-03-27 ENCOUNTER — Encounter: Payer: Self-pay | Admitting: Obstetrics & Gynecology

## 2014-03-27 VITALS — BP 112/68 | HR 60 | Ht 65.0 in | Wt 104.4 lb

## 2014-03-27 DIAGNOSIS — B079 Viral wart, unspecified: Secondary | ICD-10-CM

## 2014-03-27 DIAGNOSIS — Z30431 Encounter for routine checking of intrauterine contraceptive device: Secondary | ICD-10-CM

## 2014-03-27 DIAGNOSIS — B977 Papillomavirus as the cause of diseases classified elsewhere: Secondary | ICD-10-CM

## 2014-03-27 NOTE — Progress Notes (Signed)
Subjective:     Patient ID: Sherri Buchanan, female   DOB: 10/09/1985, 28 y.o.   MRN: [id number]  HPI Pt presents for IUD string check.  She reports a new onset lesion on her left thigh that she noted after the IUD was placed over 4 weeks ago   Review of Systems     Objective:   Physical Exam BP 112/68  Pulse 60  Ht 5\' 5"  (1.651 m)  Wt 104 lb 6.4 oz (47.356 kg)  BMI 17.37 kg/m2 Pt in NAD GU: EGBUS: no lesions Vagina: no blood in vault Cervix: no lesion; no mucopurulent d/c; IUD strings noted and in place  Left upper thigh: wart 1 large (5mm) and 2 small (1-402mm)- treated with TCA      Assessment:     IUD check Wart on thigh     Plan:     F/u in 2 weeks if lesion not completely resolved for re treatment

## 2014-03-27 NOTE — Telephone Encounter (Signed)
Pt here today for appt questions addressed.

## 2014-04-12 ENCOUNTER — Encounter: Payer: Self-pay | Admitting: *Deleted

## 2014-04-24 ENCOUNTER — Encounter: Payer: Self-pay | Admitting: Obstetrics & Gynecology

## 2015-03-29 IMAGING — US US OB COMP LESS 14 WK
1 series · 14 of 22 positions shown · non-contrast
Comparison: None.

CLINICAL DATA: Vaginal spotting.

EXAM:
OBSTETRIC <14 WK ULTRASOUND
TECHNIQUE: Transabdominal ultrasound was performed for evaluation of the
gestation as well as the maternal uterus and adnexal regions.

[Series 1: us ob comp less 14 wks · 14 of 22 slices shown]
[im 1/22]
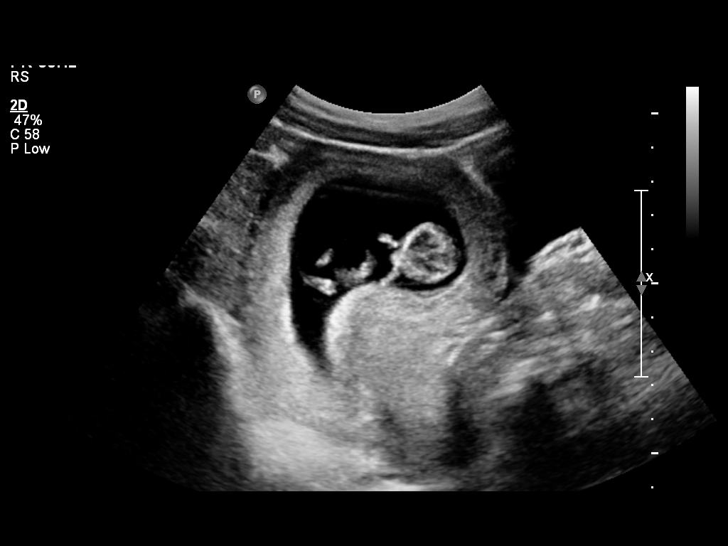
[im 3/22]
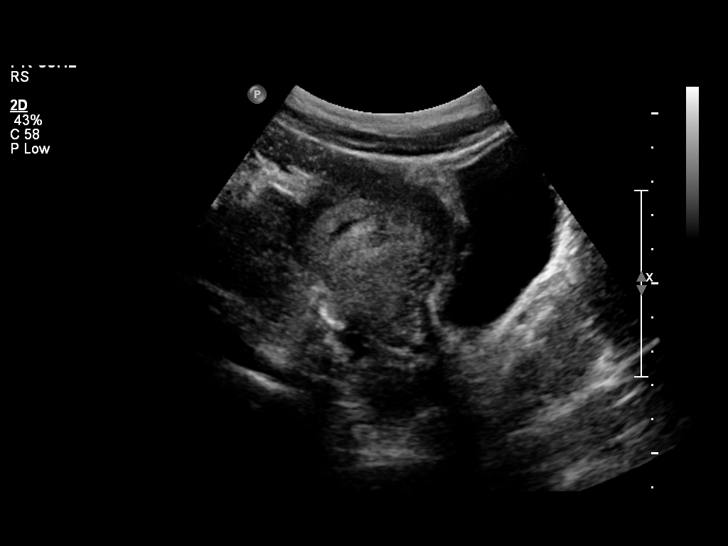
[im 4/22]
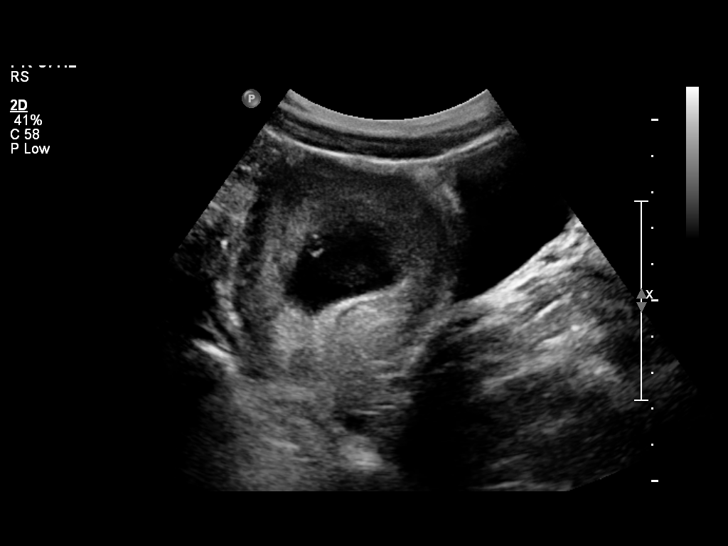
[im 6/22]
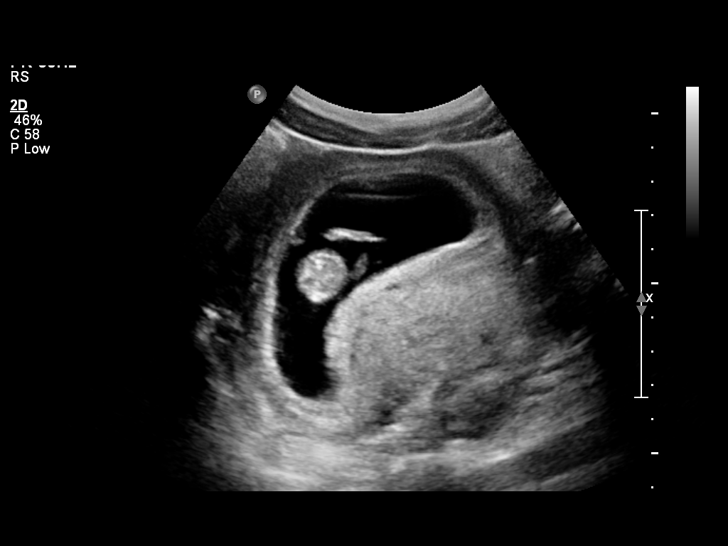
[im 8/22]
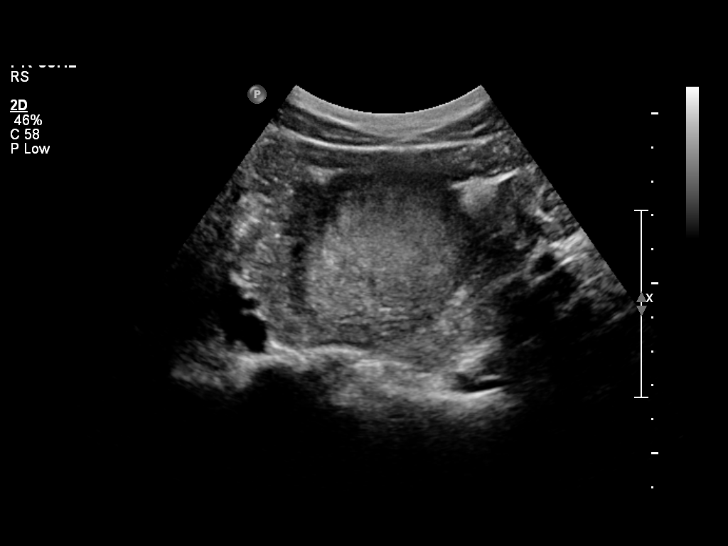
[im 9/22]
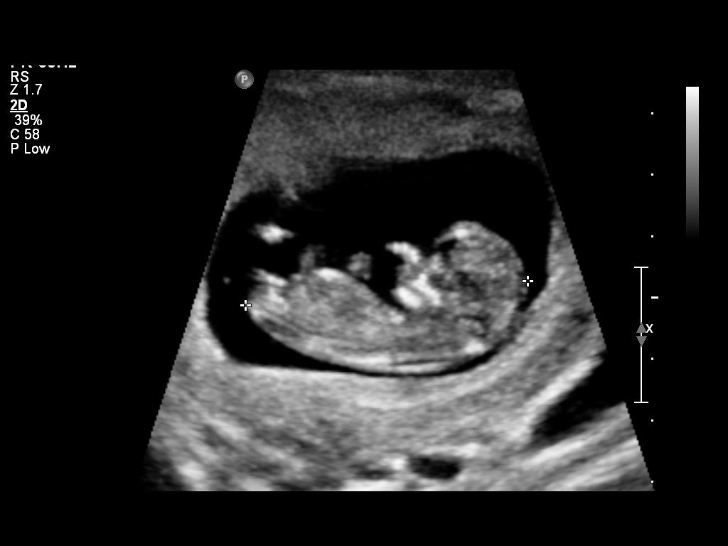
[im 11/22]
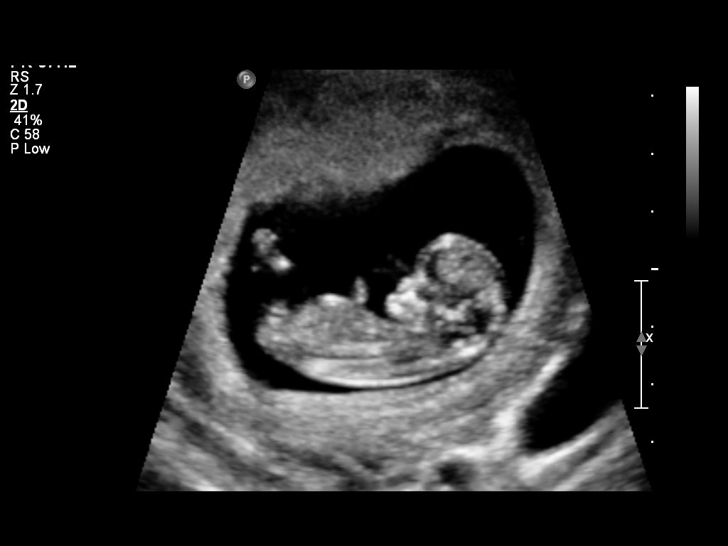
[im 12/22]
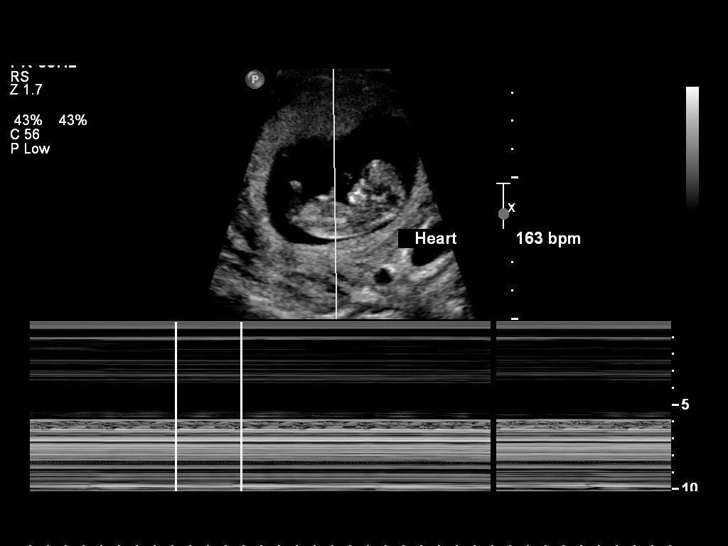
[im 14/22]
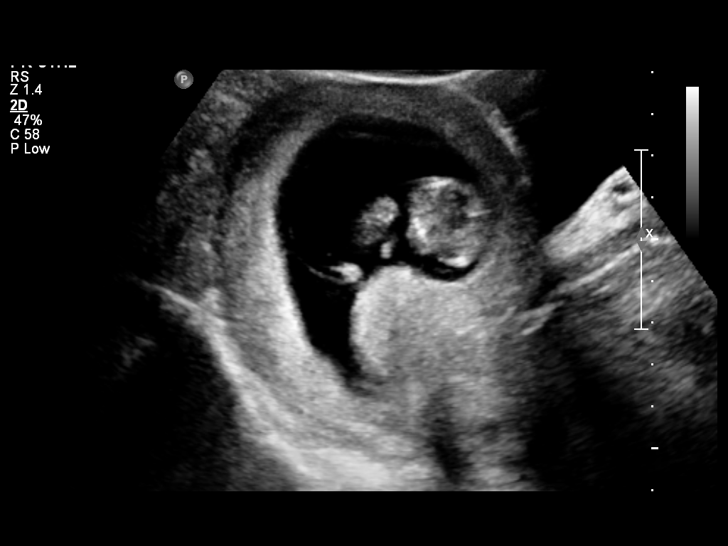
[im 15/22]
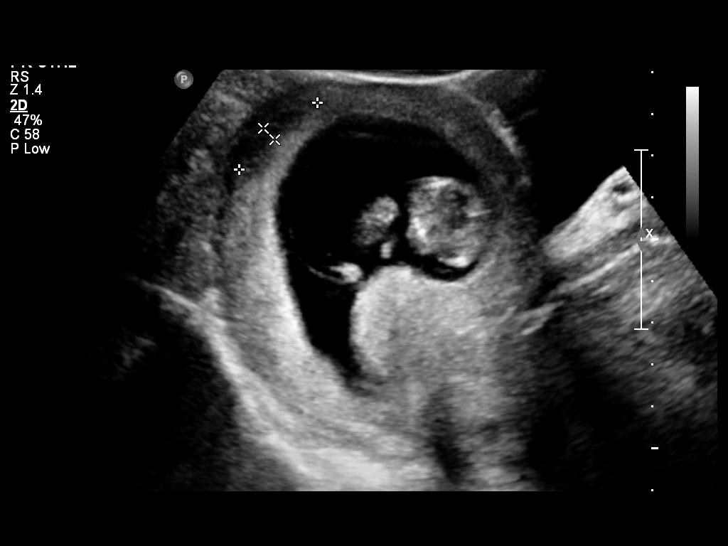
[im 17/22]
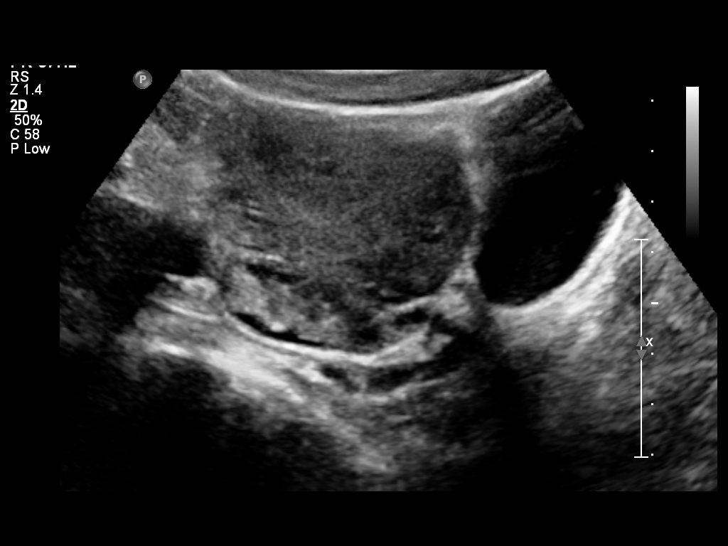
[im 19/22]
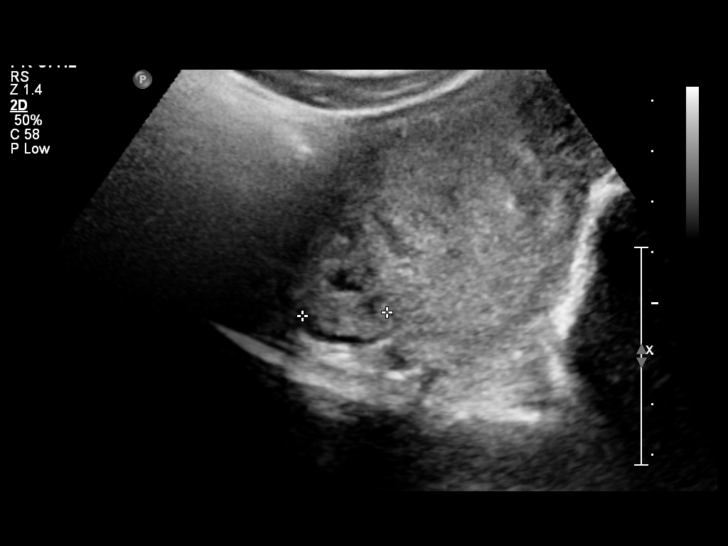
[im 20/22]
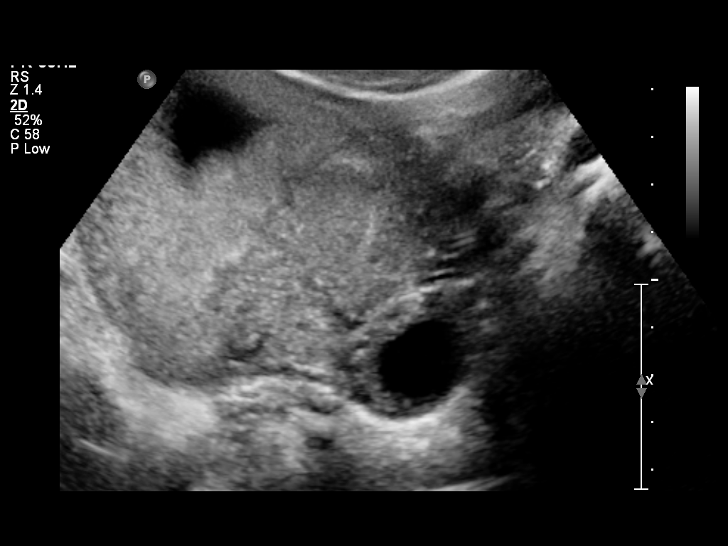
[im 22/22]
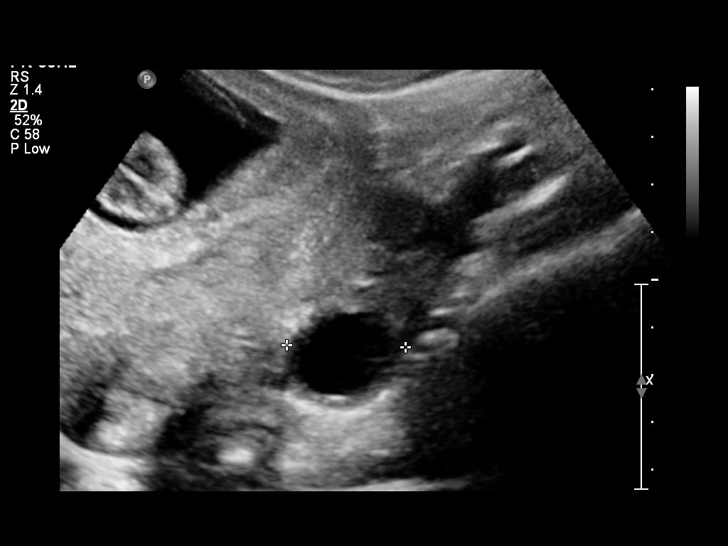

[14 of 22 positions shown; findings below may reference images not displayed]

FINDINGS: Intrauterine gestational sac: Visualized/normal in shape.

Yolk sac:  Yes

Embryo:  Yes

Cardiac Activity: Yes

Heart Rate: 163 bpm

CRL:   45.3  mm   11 w 3 d                  US EDC: 12/14/2013

Maternal uterus/adnexae: A small amount of subchorionic hemorrhage
is noted. Uterus is otherwise unremarkable in appearance.

The ovaries are within normal limits. The right ovary measures 3.4 x
1.5 x 1.7 cm, while the left ovary measures 3.3 x 2.3 x 2.5 cm. No
suspicious adnexal masses are seen; there is no evidence for ovarian
torsion.

No free fluid is seen within the pelvic cul-de-sac.
IMPRESSION: 1. Single live intrauterine pregnancy noted, with a crown-rump
length of 4.5 cm, corresponding to a gestational age of 11 weeks 3
days. This matches the gestational age of 10 weeks 5 days by LMP,
reflecting an estimated date of delivery December 19, 2013.
2. Small amount of subchorionic hemorrhage noted.

## 2015-07-01 IMAGING — US US OB FOLLOW-UP
2 series · 12 of 28 positions shown · non-contrast
Comparison: none

[Series 1: us ob follow up · 46 acquisitions, 11 frames shown (1 of 2)]
[im 2/46]
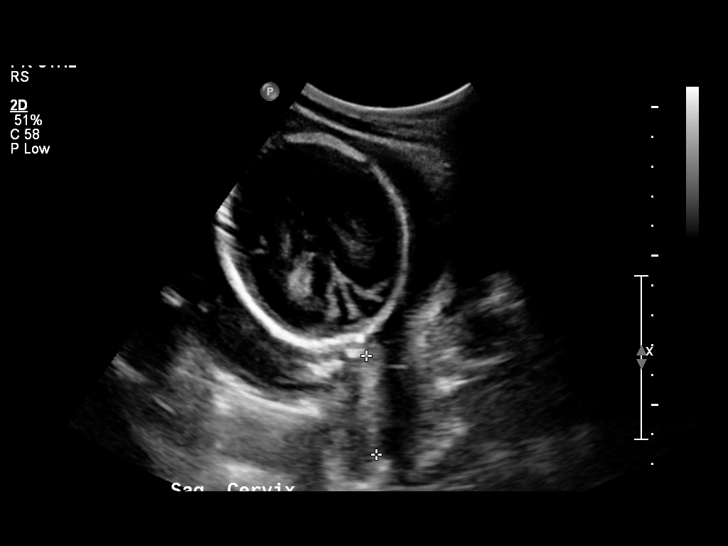
[im 6/46]
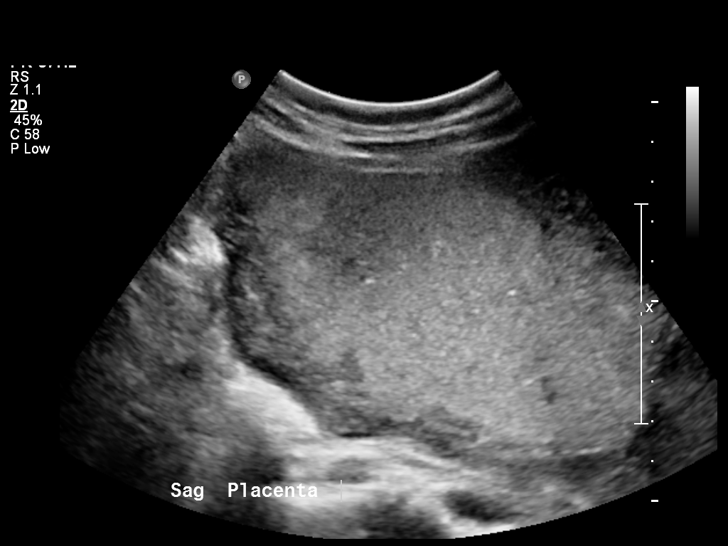
[im 10/46]
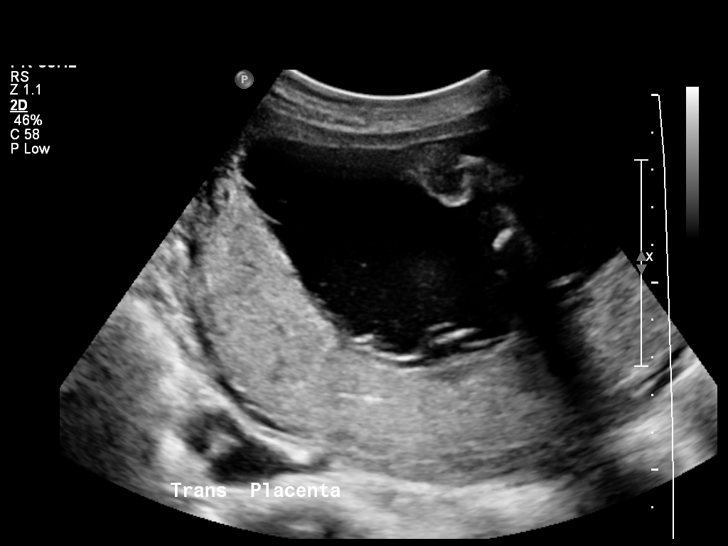
[im 16/46]
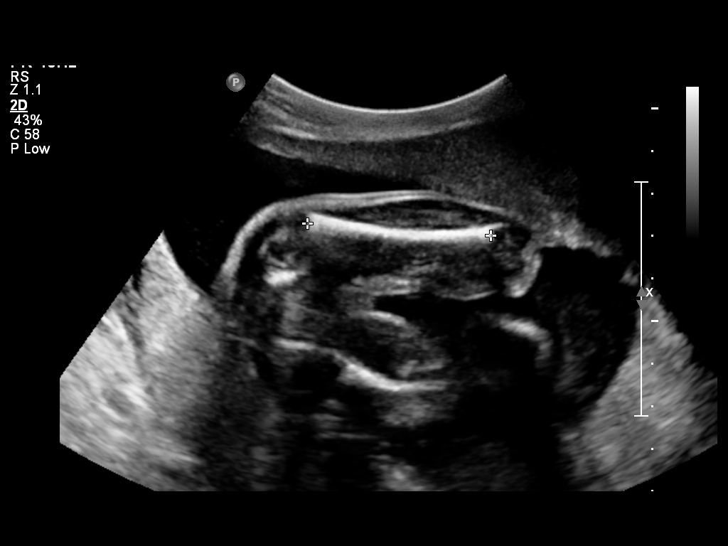
[im 19/46]
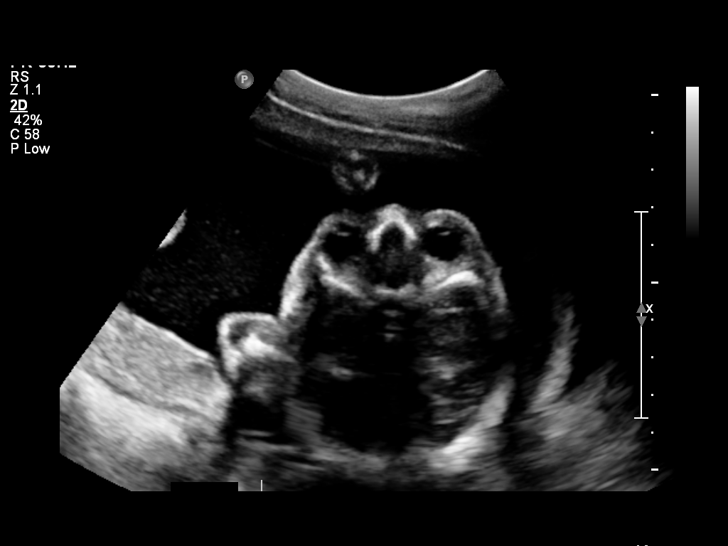
[im 23/46]
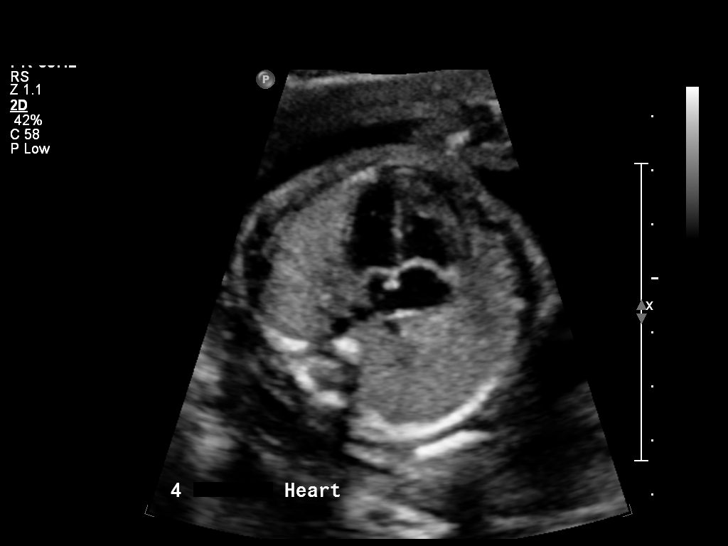
[im 29/46]
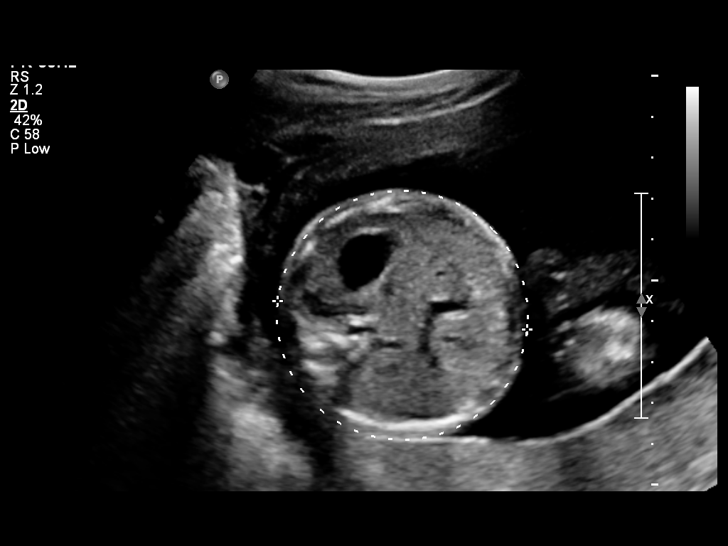
[im 32/46]
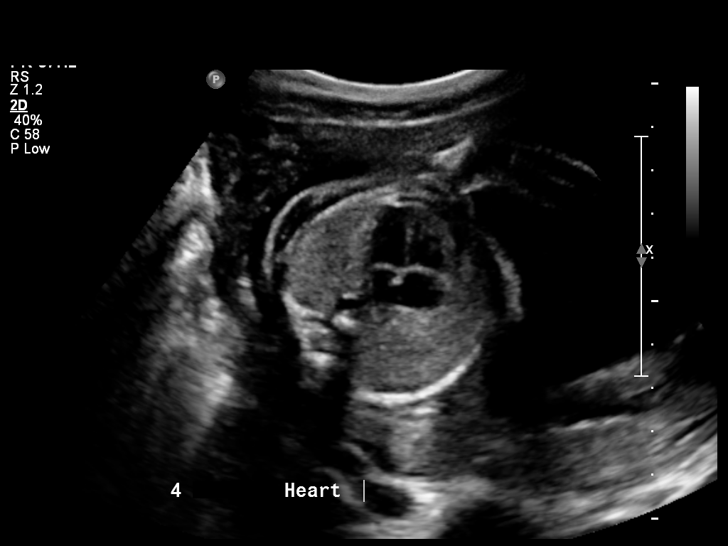
[im 36/46]
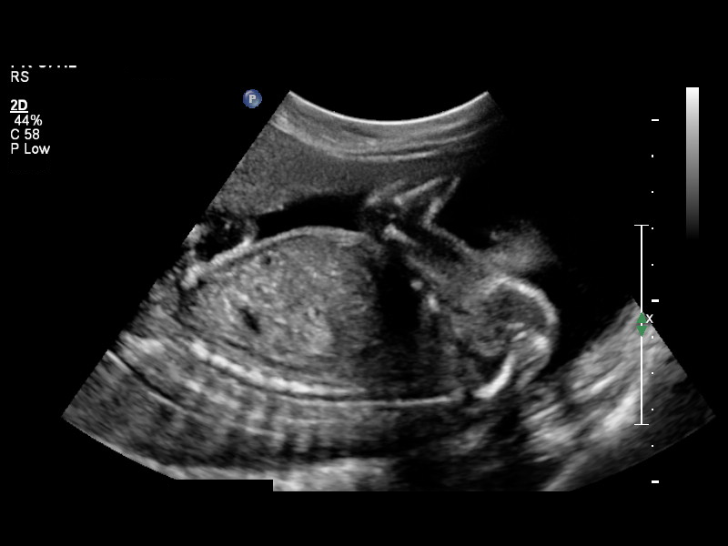
[im 42/46]
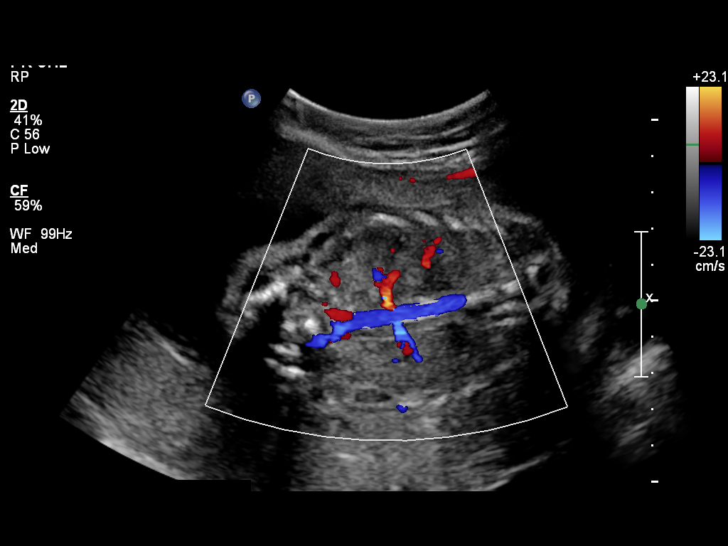
[im 46/46]
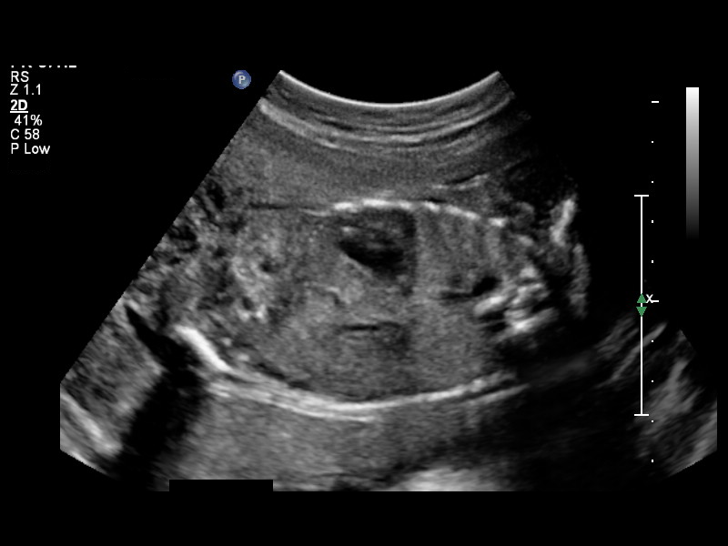

[Series 1: us ob follow up · 1 of 6 slices shown (2 of 2)]
[im 3/6]
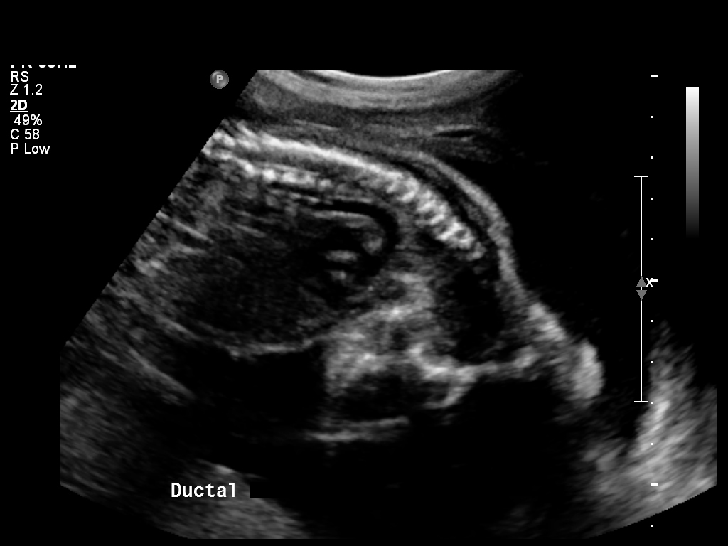

[12 of 28 positions shown; findings below may reference images not displayed]

OBSTETRICS REPORT
                      (Signed Final 08/30/2013 [DATE])

Service(s) Provided

 US OB FOLLOW UP                                       76816.1
Indications

 Follow-up incomplete fetal anatomic evaluation
Fetal Evaluation

 Num Of Fetuses:    1
 Fetal Heart Rate:  144                          bpm
 Cardiac Activity:  Observed
 Presentation:      Cephalic
 Placenta:          Posterior, above cervical
                    os
 P. Cord            Visualized, central
 Insertion:

 Amniotic Fluid
 AFI FV:      Subjectively within normal limits
                                             Larg Pckt:    4.59  cm
 RLQ:   4.59    cm
Biometry

 BPD:     61.1  mm     G. Age:  24w 6d                CI:        74.55   70 - 86
                                                      FL/HC:      19.1   18.7 -

 HC:     224.6  mm     G. Age:  24w 3d       45  %    HC/AC:      1.17   1.05 -

 AC:     192.1  mm     G. Age:  23w 6d       35  %    FL/BPD:     70.0   71 - 87
 FL:      42.8  mm     G. Age:  24w 0d       33  %    FL/AC:      22.3   20 - 24
 HUM:     42.6  mm     G. Age:  25w 4d       76  %

 Est. FW:     656  gm      1 lb 7 oz     50  %
Gestational Age

 LMP:           24w 1d        Date:  03/14/13                 EDD:   12/19/13
 U/S Today:     24w 2d                                        EDD:   12/18/13
 Best:          24w 1d     Det. By:  LMP  (03/14/13)          EDD:   12/19/13
Anatomy

 Cranium:          Appears normal         Aortic Arch:      Appears normal
 Fetal Cavum:      Appears normal         Ductal Arch:      Appears normal
 Ventricles:       Appears normal         Diaphragm:        Appears normal
 Choroid Plexus:   Previously seen        Stomach:          Appears normal, left
                                                            sided
 Cerebellum:       Previously seen        Abdomen:          Previously seen
 Posterior Fossa:  Previously seen        Abdominal Wall:   Previously seen
 Nuchal Fold:      Not applicable (>20    Cord Vessels:     Previously seen
                   wks GA)
 Face:             Appears normal         Kidneys:          Appear normal
                   (orbits and profile)
 Lips:             Appears normal         Bladder:          Appears normal
 Heart:            Appears normal         Spine:            Previously seen
                   (4CH, axis, and
                   situs)
 RVOT:             Appears normal         Lower             Previously seen
                                          Extremities:
 LVOT:             Appears normal         Upper             Previously seen
                                          Extremities:

 Other:  Fetus appears to be a female. right 5th digit visualized. Heels
         visualized.
Cervix Uterus Adnexa

 Cervical Length:    3.3      cm

 Cervix:       Normal appearance by transabdominal scan.
 Uterus:       No abnormality visualized.
 Left Ovary:    No adnexal mass visualized.
 Right Ovary:   No adnexal mass visualized.

 Adnexa:     No abnormality visualized.
Impression

 Single IUP at 24 [DATE] weeks
 Normal interval anatomy - the anatomic survey is now
 complete
 Interval growth is appropriate (50th %tile)
 Posterior placenta without previa
 Normal amniotic fluid volume
Recommendations

 Follow-up ultrasounds as clinically indicated.

 questions or concerns.
